# Patient Record
Sex: Male | Born: 1960 | Race: White | Hispanic: No | Smoking: Never smoker
Health system: Southern US, Community
[De-identification: ages and names within clinical notes are randomized; demographics above are authoritative.]

## PROBLEM LIST (undated history)

## (undated) DIAGNOSIS — E119 Type 2 diabetes mellitus without complications: Secondary | ICD-10-CM

## (undated) DIAGNOSIS — Z5189 Encounter for other specified aftercare: Secondary | ICD-10-CM

## (undated) DIAGNOSIS — N289 Disorder of kidney and ureter, unspecified: Secondary | ICD-10-CM

## (undated) DIAGNOSIS — I10 Essential (primary) hypertension: Secondary | ICD-10-CM

## (undated) HISTORY — PX: HERNIA REPAIR: SHX51

## (undated) HISTORY — PX: COMBINED KIDNEY-PANCREAS TRANSPLANT: SHX1382

## (undated) HISTORY — PX: ABDOMINAL SURGERY: SHX537

## (undated) HISTORY — PX: NEPHRECTOMY TRANSPLANTED ORGAN: SUR880

---

## 1998-08-05 ENCOUNTER — Ambulatory Visit (HOSPITAL_COMMUNITY): Admission: RE | Admit: 1998-08-05 | Discharge: 1998-08-05 | Payer: Self-pay | Admitting: Nephrology

## 1998-09-08 ENCOUNTER — Encounter: Payer: Self-pay | Admitting: Internal Medicine

## 1998-09-08 ENCOUNTER — Observation Stay (HOSPITAL_COMMUNITY): Admission: AD | Admit: 1998-09-08 | Discharge: 1998-09-09 | Payer: Self-pay | Admitting: Internal Medicine

## 1998-09-09 ENCOUNTER — Encounter: Payer: Self-pay | Admitting: Internal Medicine

## 1998-11-15 ENCOUNTER — Ambulatory Visit (HOSPITAL_COMMUNITY): Admission: RE | Admit: 1998-11-15 | Discharge: 1998-11-15 | Payer: Self-pay

## 1998-11-24 ENCOUNTER — Ambulatory Visit (HOSPITAL_COMMUNITY): Admission: RE | Admit: 1998-11-24 | Discharge: 1998-11-24 | Payer: Self-pay

## 1998-12-06 ENCOUNTER — Ambulatory Visit (HOSPITAL_COMMUNITY): Admission: RE | Admit: 1998-12-06 | Discharge: 1998-12-06 | Payer: Self-pay | Admitting: Internal Medicine

## 1998-12-19 ENCOUNTER — Ambulatory Visit (HOSPITAL_COMMUNITY): Admission: RE | Admit: 1998-12-19 | Discharge: 1998-12-19 | Payer: Self-pay | Admitting: Internal Medicine

## 2000-07-03 ENCOUNTER — Ambulatory Visit (HOSPITAL_COMMUNITY): Admission: RE | Admit: 2000-07-03 | Discharge: 2000-07-03 | Payer: Self-pay | Admitting: Neurosurgery

## 2000-07-03 ENCOUNTER — Encounter: Payer: Self-pay | Admitting: Neurosurgery

## 2000-07-14 ENCOUNTER — Emergency Department (HOSPITAL_COMMUNITY): Admission: EM | Admit: 2000-07-14 | Discharge: 2000-07-14 | Payer: Self-pay | Admitting: Emergency Medicine

## 2000-07-14 ENCOUNTER — Encounter: Payer: Self-pay | Admitting: Emergency Medicine

## 2000-11-01 ENCOUNTER — Encounter: Admission: RE | Admit: 2000-11-01 | Discharge: 2001-01-30 | Payer: Self-pay | Admitting: Internal Medicine

## 2001-12-19 ENCOUNTER — Encounter: Payer: Self-pay | Admitting: Emergency Medicine

## 2001-12-19 ENCOUNTER — Emergency Department (HOSPITAL_COMMUNITY): Admission: AC | Admit: 2001-12-19 | Discharge: 2001-12-19 | Payer: Self-pay

## 2014-08-23 ENCOUNTER — Other Ambulatory Visit: Payer: Self-pay

## 2014-08-23 ENCOUNTER — Encounter (HOSPITAL_COMMUNITY): Payer: Self-pay

## 2014-08-23 ENCOUNTER — Emergency Department (HOSPITAL_COMMUNITY): Payer: Medicare Other

## 2014-08-23 ENCOUNTER — Emergency Department (HOSPITAL_COMMUNITY)
Admission: EM | Admit: 2014-08-23 | Discharge: 2014-08-23 | Disposition: A | Discharge status: Home / Self Care | Payer: Medicare Other | Attending: Emergency Medicine | Admitting: Emergency Medicine

## 2014-08-23 DIAGNOSIS — R42 Dizziness and giddiness: Secondary | ICD-10-CM | POA: Insufficient documentation

## 2014-08-23 DIAGNOSIS — R51 Headache: Secondary | ICD-10-CM | POA: Insufficient documentation

## 2014-08-23 DIAGNOSIS — Z9483 Pancreas transplant status: Secondary | ICD-10-CM

## 2014-08-23 DIAGNOSIS — Z87448 Personal history of other diseases of urinary system: Secondary | ICD-10-CM | POA: Insufficient documentation

## 2014-08-23 DIAGNOSIS — E119 Type 2 diabetes mellitus without complications: Secondary | ICD-10-CM | POA: Diagnosis not present

## 2014-08-23 DIAGNOSIS — I10 Essential (primary) hypertension: Secondary | ICD-10-CM | POA: Diagnosis not present

## 2014-08-23 DIAGNOSIS — Z940 Kidney transplant status: Secondary | ICD-10-CM

## 2014-08-23 DIAGNOSIS — R112 Nausea with vomiting, unspecified: Secondary | ICD-10-CM | POA: Diagnosis present

## 2014-08-23 HISTORY — DX: Encounter for other specified aftercare: Z51.89

## 2014-08-23 HISTORY — DX: Disorder of kidney and ureter, unspecified: N28.9

## 2014-08-23 HISTORY — DX: Type 2 diabetes mellitus without complications: E11.9

## 2014-08-23 HISTORY — DX: Essential (primary) hypertension: I10

## 2014-08-23 LAB — COMPREHENSIVE METABOLIC PANEL
ALBUMIN: 3.7 g/dL (ref 3.5–5.2)
ALT: 24 U/L (ref 0–53)
ANION GAP: 11 (ref 5–15)
AST: 21 U/L (ref 0–37)
Alkaline Phosphatase: 77 U/L (ref 39–117)
BUN: 32 mg/dL — ABNORMAL HIGH (ref 6–23)
CALCIUM: 9.6 mg/dL (ref 8.4–10.5)
CHLORIDE: 109 meq/L (ref 96–112)
CO2: 23 meq/L (ref 19–32)
Creatinine, Ser: 1.25 mg/dL (ref 0.50–1.35)
GFR calc non Af Amer: 63 mL/min — ABNORMAL LOW (ref >90)
GFR, EST AFRICAN AMERICAN: 73 mL/min — AB (ref >90)
Glucose, Bld: 127 mg/dL — ABNORMAL HIGH (ref 70–99)
POTASSIUM: 4.6 meq/L (ref 3.7–5.3)
SODIUM: 143 meq/L (ref 137–147)
TOTAL PROTEIN: 7.2 g/dL (ref 6.0–8.3)
Total Bilirubin: 0.5 mg/dL (ref 0.3–1.2)

## 2014-08-23 LAB — URINALYSIS, ROUTINE W REFLEX MICROSCOPIC
BILIRUBIN URINE: NEGATIVE
GLUCOSE, UA: NEGATIVE mg/dL
Hgb urine dipstick: NEGATIVE
KETONES UR: NEGATIVE mg/dL
Leukocytes, UA: NEGATIVE
Nitrite: NEGATIVE
PH: 6 (ref 5.0–8.0)
PROTEIN: NEGATIVE mg/dL
Specific Gravity, Urine: 1.02 (ref 1.005–1.030)
Urobilinogen, UA: 0.2 mg/dL (ref 0.0–1.0)

## 2014-08-23 LAB — CBC WITH DIFFERENTIAL/PLATELET
BASOS ABS: 0 10*3/uL (ref 0.0–0.1)
Basophils Relative: 0 % (ref 0–1)
Eosinophils Absolute: 0.1 10*3/uL (ref 0.0–0.7)
Eosinophils Relative: 1 % (ref 0–5)
HEMATOCRIT: 39.9 % (ref 39.0–52.0)
Hemoglobin: 14.3 g/dL (ref 13.0–17.0)
LYMPHS PCT: 8 % — AB (ref 12–46)
Lymphs Abs: 0.6 10*3/uL — ABNORMAL LOW (ref 0.7–4.0)
MCH: 33.6 pg (ref 26.0–34.0)
MCHC: 35.8 g/dL (ref 30.0–36.0)
MCV: 93.9 fL (ref 78.0–100.0)
MONO ABS: 0.2 10*3/uL (ref 0.1–1.0)
Monocytes Relative: 3 % (ref 3–12)
NEUTROS ABS: 7.2 10*3/uL (ref 1.7–7.7)
NEUTROS PCT: 88 % — AB (ref 43–77)
PLATELETS: 156 10*3/uL (ref 150–400)
RBC: 4.25 MIL/uL (ref 4.22–5.81)
RDW: 13.2 % (ref 11.5–15.5)
WBC: 8.2 10*3/uL (ref 4.0–10.5)

## 2014-08-23 MED ORDER — MECLIZINE HCL 25 MG PO TABS: 25.0000 mg | ORAL_TABLET | Freq: Three times a day (TID) | ORAL | Status: AC | PRN

## 2014-08-23 MED ORDER — DIAZEPAM 5 MG/ML IJ SOLN
5.0000 mg | Freq: Once | INTRAMUSCULAR | Status: AC
  Administered 2014-08-23: 5 mg via INTRAVENOUS
  Filled 2014-08-23: qty 2

## 2014-08-23 MED ORDER — PROMETHAZINE HCL 25 MG/ML IJ SOLN
25.0000 mg | Freq: Once | INTRAMUSCULAR | Status: AC
  Administered 2014-08-23: 25 mg via INTRAMUSCULAR
  Filled 2014-08-23: qty 1

## 2014-08-23 MED ORDER — DIAZEPAM 5 MG PO TABS: 5.0000 mg | ORAL_TABLET | Freq: Two times a day (BID) | ORAL | Status: AC

## 2014-08-23 MED ORDER — MECLIZINE HCL 12.5 MG PO TABS
25.0000 mg | ORAL_TABLET | Freq: Once | ORAL | Status: AC
  Administered 2014-08-23: 25 mg via ORAL
  Filled 2014-08-23: qty 2

## 2014-08-23 MED ORDER — SODIUM CHLORIDE 0.9 % IV BOLUS (SEPSIS)
1000.0000 mL | Freq: Once | INTRAVENOUS | Status: AC
  Administered 2014-08-23: 1000 mL via INTRAVENOUS

## 2014-08-23 NOTE — ED Notes (Signed)
Patient states "I really need to pee and I can't pee laying down. I can't sit up or stand up because I get really dizzy and vomit." Patient requesting urinary catheter to empty bladder. Advised Dr Judd LieneLo. Verbal order for in and out cath to empty bladder.

## 2014-08-23 NOTE — Discharge Instructions (Signed)
Meclizine as prescribed as needed for vertigo.  Valium as prescribed as needed for symptoms not relieved with meclizine.  Follow-up with your primary Dr. If not improving in the next week, and return to the ER if your symptoms substantially worsen or change.   Vertigo Vertigo means you feel like you or your surroundings are moving when they are not. Vertigo can be dangerous if it occurs when you are at work, driving, or performing difficult activities.  CAUSES  Vertigo occurs when there is a conflict of signals sent to your brain from the visual and sensory systems in your body. There are many different causes of vertigo, including:  Infections, especially in the inner ear.  A bad reaction to a drug or misuse of alcohol and medicines.  Withdrawal from drugs or alcohol.  Rapidly changing positions, such as lying down or rolling over in bed.  A migraine headache.  Decreased blood flow to the brain.  Increased pressure in the brain from a head injury, infection, tumor, or bleeding. SYMPTOMS  You may feel as though the world is spinning around or you are falling to the ground. Because your balance is upset, vertigo can cause nausea and vomiting. You may have involuntary eye movements (nystagmus). DIAGNOSIS  Vertigo is usually diagnosed by physical exam. If the cause of your vertigo is unknown, your caregiver may perform imaging tests, such as an MRI scan (magnetic resonance imaging). TREATMENT  Most cases of vertigo resolve on their own, without treatment. Depending on the cause, your caregiver may prescribe certain medicines. If your vertigo is related to body position issues, your caregiver may recommend movements or procedures to correct the problem. In rare cases, if your vertigo is caused by certain inner ear problems, you may need surgery. HOME CARE INSTRUCTIONS   Follow your caregiver's instructions.  Avoid driving.  Avoid operating heavy machinery.  Avoid performing any  tasks that would be dangerous to you or others during a vertigo episode.  Tell your caregiver if you notice that certain medicines seem to be causing your vertigo. Some of the medicines used to treat vertigo episodes can actually make them worse in some people. SEEK IMMEDIATE MEDICAL CARE IF:   Your medicines do not relieve your vertigo or are making it worse.  You develop problems with talking, walking, weakness, or using your arms, hands, or legs.  You develop severe headaches.  Your nausea or vomiting continues or gets worse.  You develop visual changes.  A family member notices behavioral changes.  Your condition gets worse. MAKE SURE YOU:  Understand these instructions.  Will watch your condition.  Will get help right away if you are not doing well or get worse. Document Released: 07/04/2005 Document Revised: 12/17/2011 Document Reviewed: 04/12/2011 [facility] Patient Information 2015 [place], [place]. This information is not intended to replace advice given to you by your health care provider. Make sure you discuss any questions you have with your health care provider.

## 2014-08-23 NOTE — ED Provider Notes (Addendum)
CSN: [id number]     Arrival date & time 08/23/14  1037 History  This chart was scribed for Thomas Lyonsouglas Willodene Stallings, MD by Abel PrestoKara Demonbreun, ED Scribe. This patient was seen in room APA06/APA06 and the patient's care was started at 11:22 AM.    Chief Complaint  Patient presents with  . Nausea  . Emesis    HPI HPI Comments: Thomas Oconnor is a 53 y.o. male with history of who presents to the Emergency Department complaining of dizziness with onset last night.  Pt notes associated nausea and vomiting 4-7 times starting this morning. Pt also notes pain in center of forehead. Pt notes abdominal discomfort since he has been unable to get up and have a bowel movement or urinate.  Pt has FHx of vertigo. Pt has PMHx of juvenile diabetes, blindness in left eye, 5 hernias, 13 staples in head, 2 kidney transplants, and a pancreas transplant.  He states he has neuropathy complications and has needed to rely on his cane more frequently in the past few days. Pt denies tinnitus, loss of hearing, head injury, and fever.    Past Medical History  Diagnosis Date  . Renal disorder   . Hypertension   . Diabetes mellitus without complication   . Blood transfusion without reported diagnosis    Past Surgical History  Procedure Laterality Date  . Nephrectomy transplanted organ    . Hernia repair    . Abdominal surgery    . Combined kidney-pancreas transplant Bilateral    History reviewed. No pertinent family history. History  Substance Use Topics  . Smoking status: Never Smoker   . Smokeless tobacco: Not on file  . Alcohol Use: No    Review of Systems  A complete 10 system review of systems was obtained and all systems are negative except as noted in the HPI and PMH.      Allergies  Review of patient's allergies indicates no known allergies.  Home Medications   Prior to Admission medications   Not on File   BP 153/88 mmHg  Pulse 80  Temp(Src) 98 F (36.7 C) (Oral)  Resp 15  Ht 5' 8.5" (1.74 m)   Wt 180 lb (81.647 kg)  BMI 26.97 kg/m2  SpO2 95% Physical Exam  Constitutional: He is oriented to person, place, and time. He appears well-developed and well-nourished. No distress.  HENT:  Head: Normocephalic and atraumatic.  Mouth/Throat: Oropharynx is clear and moist.  Eyes: EOM are normal. Pupils are equal, round, and reactive to light.  Neck: Normal range of motion. Neck supple.  Cardiovascular: Normal rate, regular rhythm, normal heart sounds and intact distal pulses.   No murmur heard. Pulmonary/Chest: Effort normal and breath sounds normal. No respiratory distress. He has no wheezes.  Abdominal: Soft. Bowel sounds are normal. He exhibits no distension. There is no tenderness.  Musculoskeletal: Normal range of motion.  Lymphadenopathy:    He has no cervical adenopathy.  Neurological: He is alert and oriented to person, place, and time. No cranial nerve deficit. He exhibits normal muscle tone. Coordination normal.  Skin: Skin is warm and dry. He is not diaphoretic.  Nursing note and vitals reviewed.   ED Course  Procedures (including critical care time) DIAGNOSTIC STUDIES: Oxygen Saturation is 95% on room air, normal by my interpretation.    COORDINATION OF CARE: 11:28 AM Discussed treatment plan with patient at beside, the patient agrees with the plan and has no further questions at this time.   Labs Review Labs Reviewed -  No data to display  Imaging Review No results found.   EKG Interpretation   Date/Time:  Monday August 23 2014 10:38:31 EST Ventricular Rate:  84 PR Interval:  200 QRS Duration: 87 QT Interval:  371 QTC Calculation: 438 R Axis:   78 Text Interpretation:  Sinus rhythm Normal ECG Confirmed by DELOS  MD,  Marquesha Robideau ([id number]) on 08/23/2014 1:25:26 PM      MDM   Final diagnoses:  None    Patient is a 53 year old male with past medical history including type 1 diabetes and end-stage renal disease. He is status post pancreas and renal  transplant. He presents today with complaints of dizziness. He describes this as a spinning sensation that worsens with head movement and change in position. It is improved with rest and closing his eyes.  Symptoms are consistent with a peripheral vertigo and workup reveals nothing to suggest an alternative diagnosis. He is feeling better with IV fluids, meclizine, and Valium. At this point he will be discharged to home with prescriptions for the above medications and understands to return if his symptoms substantially worsen or change.    Thomas Lyonsouglas Carrel Leather, MD 08/23/14 1327  Thomas Lyonsouglas Roberta Kelly, MD 09/01/14 [id number]  Thomas Lyonsouglas Aizah Gehlhausen, MD 09/07/14 [phone]

## 2014-08-23 NOTE — ED Notes (Signed)
Pt reports dizziness and n/v experienced this morning. Pt reports 6 large amounts of emesis. Pt reports experiencing loss in stabililty of the course of 3 days.

## 2014-08-23 NOTE — ED Notes (Signed)
Patient currently vomiting in room at this time. Patient states he sat up with family members assistance and started vomiting. Advised Dr Judd LieneLo. Verbal order for phenergan 25 mg IM.

## 2014-08-23 NOTE — ED Notes (Signed)
Emptied bladder of 900 ml urine via in and out catheter. Patient tolerated well.

## 2014-08-23 NOTE — ED Notes (Signed)
Pt woke up this morning swimming headed with n/v, Hx of HTN, Inner ear, x1 vomiting. Hx of 2 kidney transplants, x5 Hernias. Pt is completely blind in left eye. 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 Difficulty urinating.  EMS Vitals BP 170/71 HR 80 SPO2 97

## 2014-08-23 NOTE — ED Notes (Signed)
Discharged patient via wheelchair with sister to drive him home. Patient ambulated to wheelchair with 2 assists. No vomiting noted since phenergan administration.

## 2014-11-27 IMAGING — CT CT HEAD W/O CM
1 series · 16 of 30 positions shown, 20 images · non-contrast
Comparison: None.

CLINICAL DATA: Dizziness.

EXAM:
CT HEAD WITHOUT CONTRAST
TECHNIQUE: Contiguous axial images were obtained from the base of the skull
through the vertex without intravenous contrast.

[Series 2: headseq 4.8 h37s · axial · 0.45mm/px · z∈[+108,+273]mm · 16 of 36 slices shown, 20 images]
[im 2/36  brain]
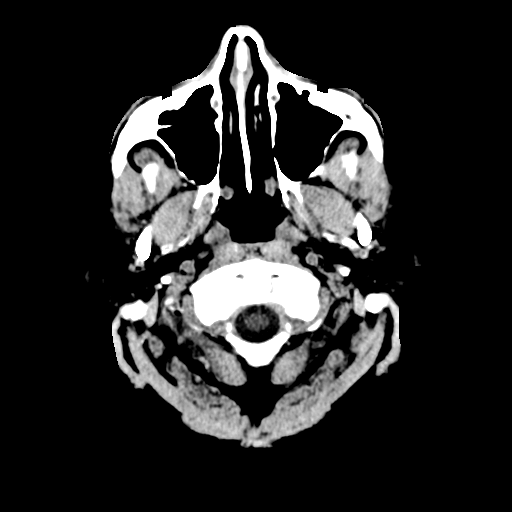
[im 2/36  bone]
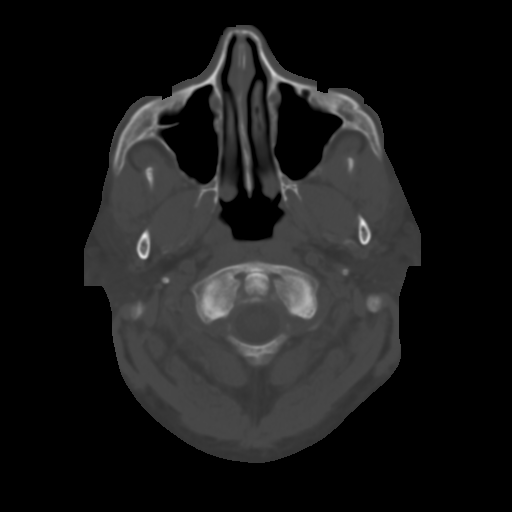
[im 4/36  brain]
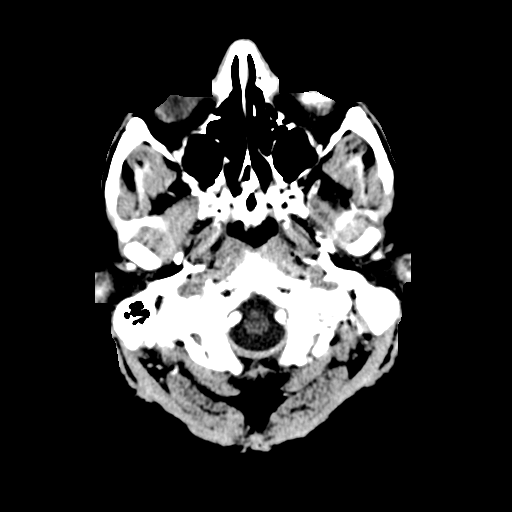
[im 7/36  brain]
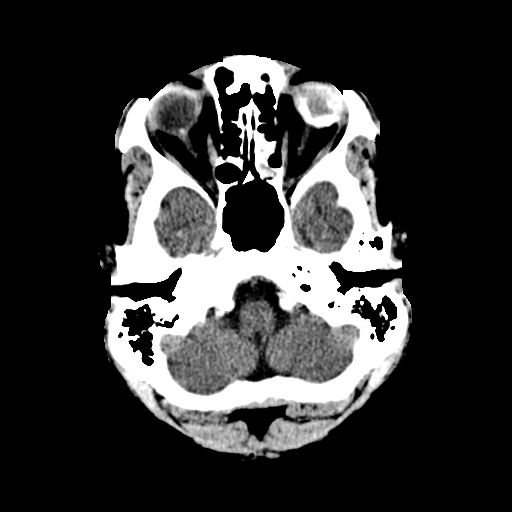
[im 9/36  brain]
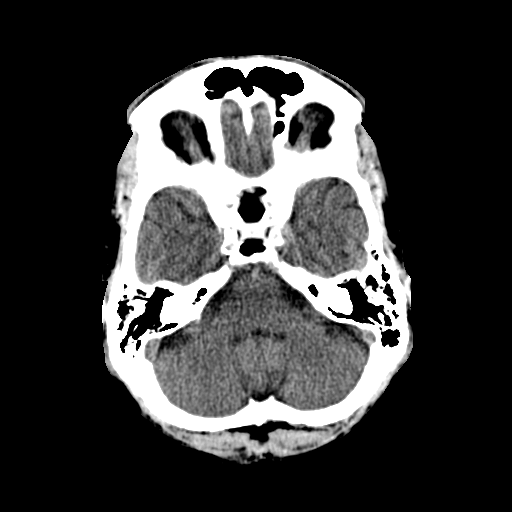
[im 10/36  brain]
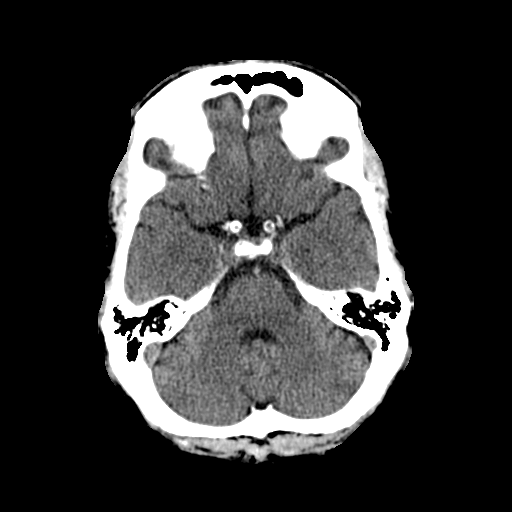
[im 10/36  bone]
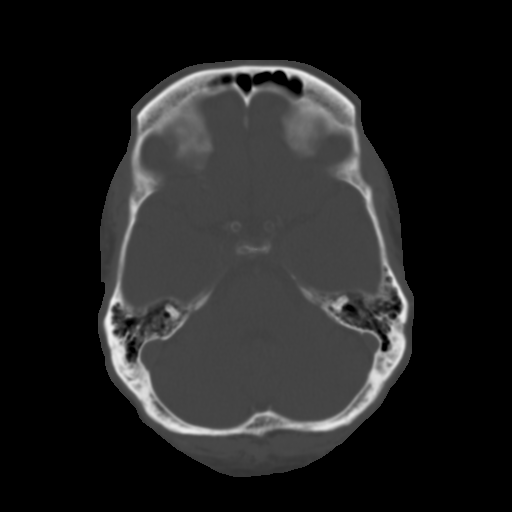
[im 13/36  brain]
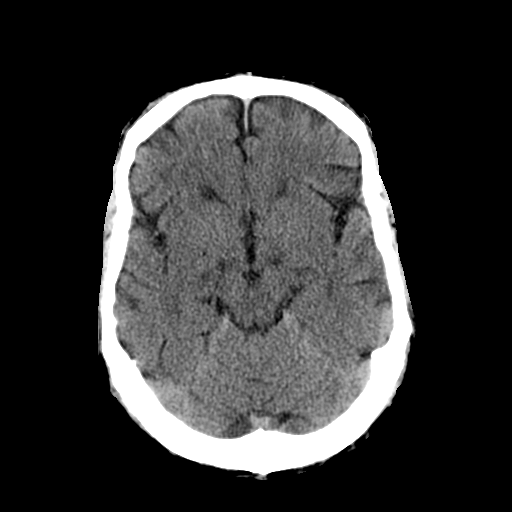
[im 15/36  brain]
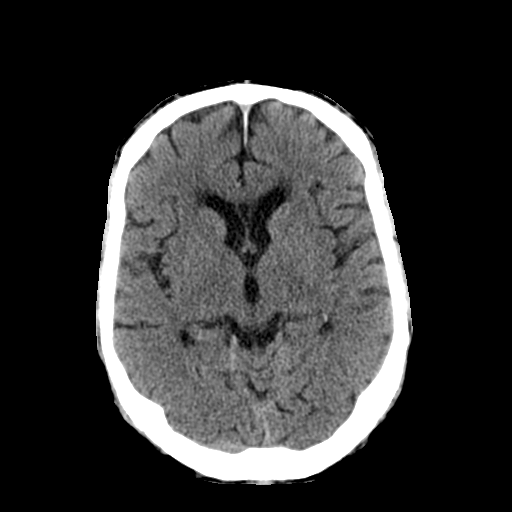
[im 17/36  brain]
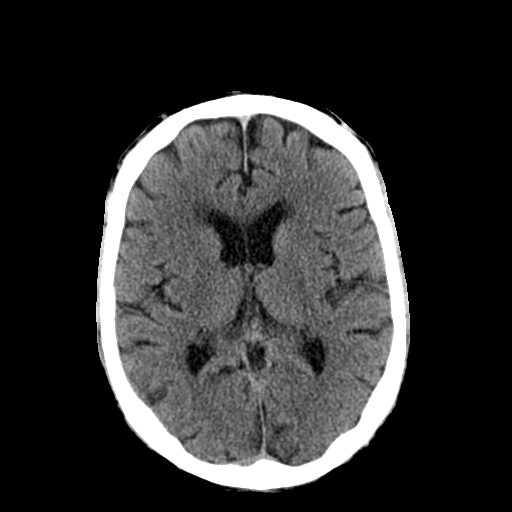
[im 19/36  brain]
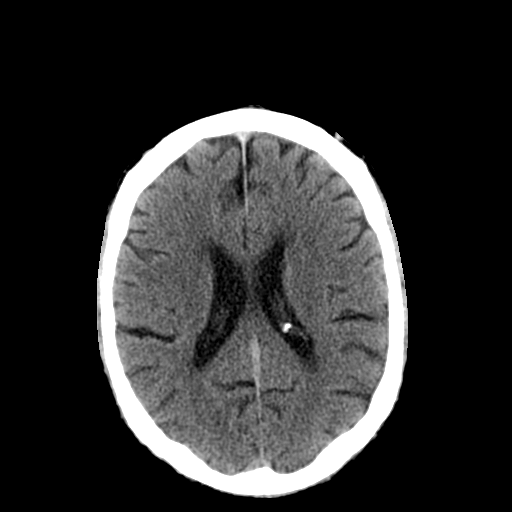
[im 19/36  bone]
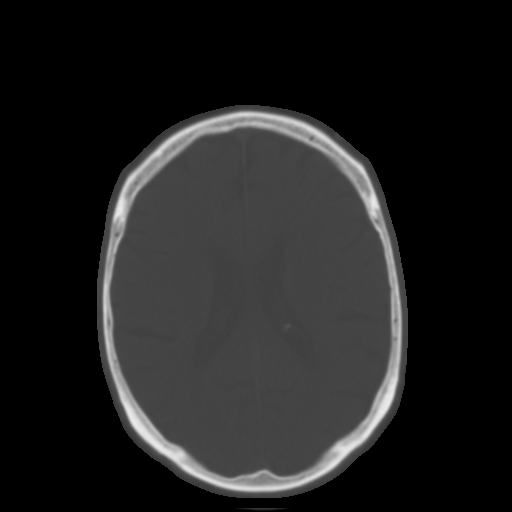
[im 21/36  brain]
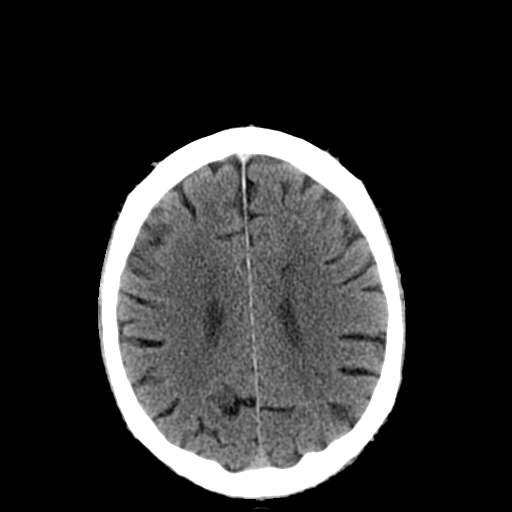
[im 23/36  brain]
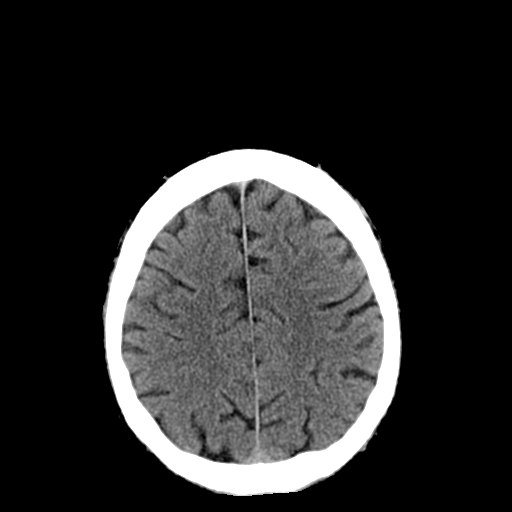
[im 26/36  brain]
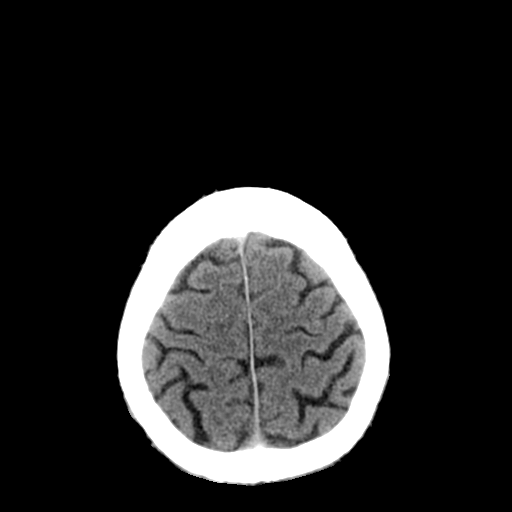
[im 27/36  brain]
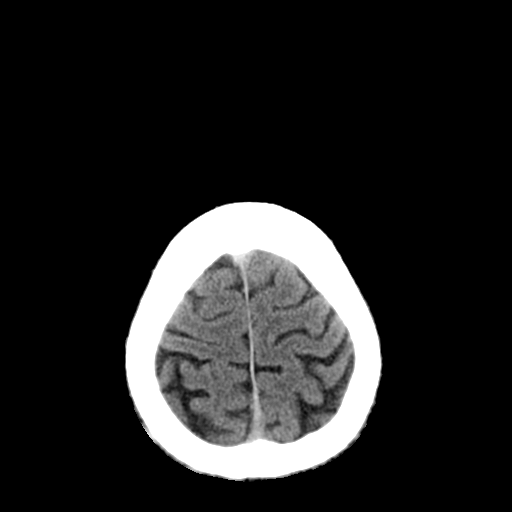
[im 27/36  bone]
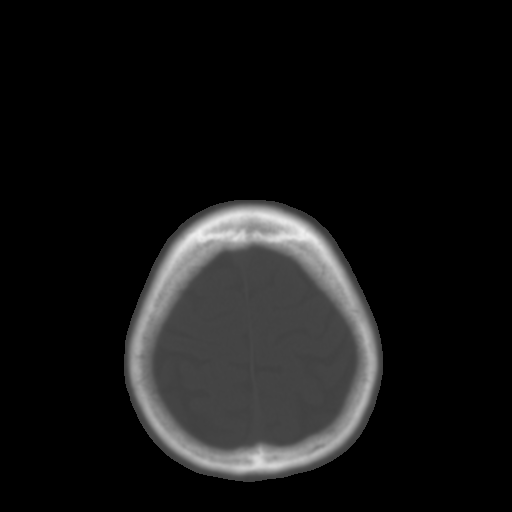
[im 29/36  brain]
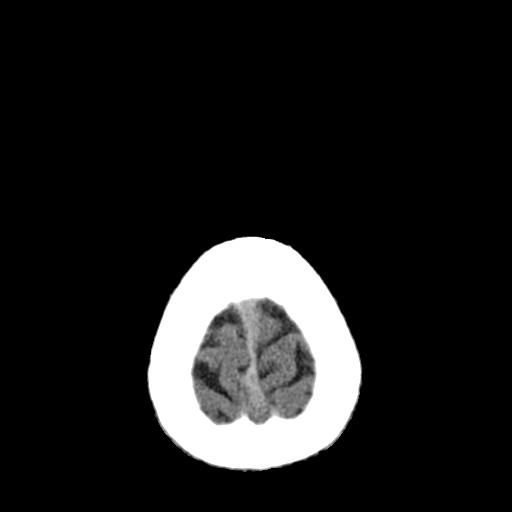
[im 32/36  brain]
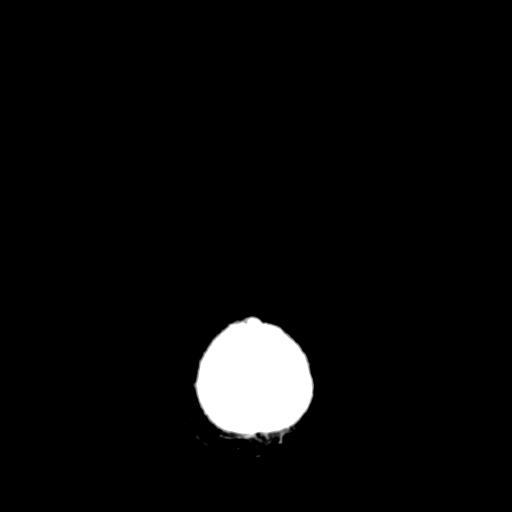
[im 34/36  brain]
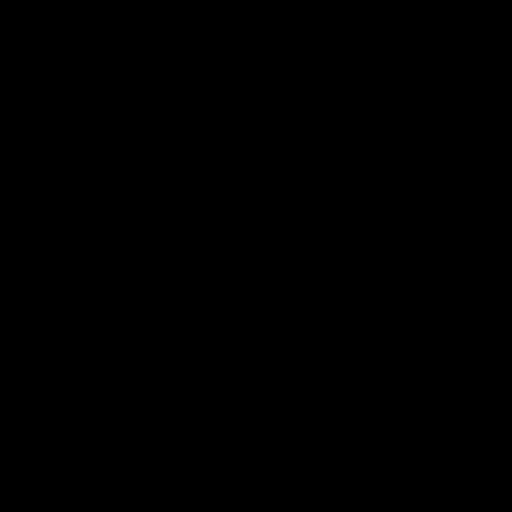

[16 of 30 positions shown; findings below may reference images not displayed]

FINDINGS: Bony calvarium appears intact. Minimal diffuse cortical atrophy is
noted. Minimal chronic ischemic white matter disease is noted. No
mass effect or midline shift is noted. Ventricular size is within
normal limits. There is no evidence of mass lesion, hemorrhage or
acute infarction.
IMPRESSION: Minimal diffuse cortical atrophy. Minimal chronic ischemic white
matter disease. No acute intracranial abnormality seen.

## 2015-01-19 ENCOUNTER — Ambulatory Visit (HOSPITAL_COMMUNITY): Payer: Medicare Other | Attending: Orthopedic Surgery | Admitting: Physical Therapy

## 2015-01-19 DIAGNOSIS — H540 Blindness, both eyes: Secondary | ICD-10-CM | POA: Diagnosis not present

## 2015-01-19 DIAGNOSIS — Z9483 Pancreas transplant status: Secondary | ICD-10-CM | POA: Diagnosis not present

## 2015-01-19 DIAGNOSIS — R262 Difficulty in walking, not elsewhere classified: Secondary | ICD-10-CM | POA: Diagnosis not present

## 2015-01-19 DIAGNOSIS — Z9181 History of falling: Secondary | ICD-10-CM | POA: Diagnosis not present

## 2015-01-19 DIAGNOSIS — I10 Essential (primary) hypertension: Secondary | ICD-10-CM | POA: Insufficient documentation

## 2015-01-19 DIAGNOSIS — Z940 Kidney transplant status: Secondary | ICD-10-CM | POA: Diagnosis not present

## 2015-01-19 DIAGNOSIS — H8110 Benign paroxysmal vertigo, unspecified ear: Secondary | ICD-10-CM | POA: Diagnosis present

## 2015-01-19 NOTE — Therapy (Signed)
[facility] [address] [place], [place], [address] Phone: [phone]   Fax:  [phone]  Physical Therapy Evaluation  Patient Details  Name: Thomas Oconnor MRN: [id number] Date of Birth: 1961/05/25 Referring Provider:  Corie Chiquito, MD  Encounter Date: 01/19/2015      PT End of Session - 01/19/15 1214    Visit Number 1   Number of Visits 16   Date for PT Re-Evaluation 02/18/15   Authorization Type medicare   Authorization Time Period 12/19/14-6/123/16   Authorization - Visit Number 1   Authorization - Number of Visits 10   PT Start Time 0930   PT Stop Time 1015   PT Time Calculation (min) 45 min   Activity Tolerance Patient tolerated treatment well   Behavior During Therapy [facility] for tasks assessed/performed      Past Medical History  Diagnosis Date  . Renal disorder   . Hypertension   . Diabetes mellitus without complication   . Blood transfusion without reported diagnosis     Past Surgical History  Procedure Laterality Date  . Nephrectomy transplanted organ    . Hernia repair    . Abdominal surgery    . Combined kidney-pancreas transplant Bilateral     There were no vitals filed for this visit.  Visit Diagnosis:  Benign paroxysmal positional vertigo, unspecified laterality  At high risk for falls  Difficulty walking      Subjective Assessment - 01/19/15 0937    Subjective No symptom of vertigo oday, patient notes a denseness in forehead when symptoms are present.    Pertinent History Patient began having vertigo 08/22/14 and patient was leanign heavy on cane , unsure why but when he sat up the "room took off and everything sterted spinning patient cauge self to prevent self from falling. patient was throwing up due to severity of dizziness. Patient went to hospital. patient noted continued dry heaves. patient was given mecklazine and stopped throwing up. patient has continued to have some minor bouts of  vertigo but with decreased severity. aptient notes that 9/10  severity in light headedness. 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 Patient had type 1 diabetes. patient has a history of diabetic neurapathy that has since improved. .    Diagnostic tests no MRI or CT scan yet resulting to deifficulty traveling to docotrs office.    Patient Stated Goals patient want to be abl to decrease the frequency and intensity of vertigo/dizziness.    Currently in Pain? No/denies            [facility] PT Assessment - 01/19/15 0001    Assessment   Medical Diagnosis Vertigo   Onset Date 08/22/14   Next MD Visit Nelida Gores   Prior Therapy no   Balance Screen   Has the patient fallen in the past 6 months No   Has the patient had a decrease in activity level because of a fear of falling?  No   Is the patient reluctant to leave their home because of a fear of falling?  No   Prior Function   Level of Independence Independent with basic ADLs;Needs assistance with ADLs   Observation/Other Assessments   Focus on Therapeutic Outcomes (FOTO)  45% limited   Dynamic Gait Index   Level Surface Moderate Impairment   Change in Gait Speed Moderate Impairment   Gait with Horizontal Head Turns Moderate Impairment   Gait with Vertical Head Turns  Moderate Impairment   Gait and Pivot Turn Severe Impairment   Step Over Obstacle Severe Impairment   Step Around Obstacles Severe Impairment   Steps Severe Impairment   Total Score 4      Negative hall pike Dix bilateral Eye pursuits with head turns         PT Short Term Goals - 01/19/15 1233    PT SHORT TERM GOAL #1   Title Patient will dmeonstrate a DGI score of >10 to indicate improvign balance during gait.    Time 4   Period Weeks   Status New   PT SHORT TERM GOAL #2   Title Patient ill state decreased frequency of vertigo symptoms to <1x daily and at and intensity <5/10   Baseline Experiences vertigo >1x daily  with symptom intensity  >9/10   Time 4   Period Weeks   Status New   PT SHORT TERM GOAL #3   Title Patient will dmeosntrate independence of HEP.    Time 4   Period Weeks   Status New           PT Long Term Goals - 01/19/15 1235    PT LONG TERM GOAL #1   Title Patient will dmeonstrate a DGI score of >20 to indicate improving balance during gait and patient not at high risk of falls.    Time 8   Period Weeks   Status New   PT LONG TERM GOAL #2   Title Patient ill state decreased frequency of vertigo symptoms to <1x weekly and at and intensity <3/10   Time 8   Period Weeks   Status New   PT LONG TERM GOAL #3   Title Patient will state confidence in standing and sitting balance.    Time 8   Period Weeks   Status New               Plan - 01/19/15 1224    Clinical Impression Statement Physical therapist had difficulty reproducing patient's vertigo symptoms attributed to Benign Paraoxsysmal Peripheral Vertigo as patient never experienced symptoms thorughotu session despite perfomance of provacative tests. Patient was educated on visual tracking exercises and visual stabilization exercises to HEP. Patient's gait was then assessed with Patient dispalsying decreased balance and difficulty walking possibly related to LE weakness and stiffness but also attributed to patient's dispalsying decreased body awareness and unsurety of self in space with patient displaying a high falls risk as indicated by performance on Dynamic gait Index. Patient will benefit from skilled physical therapy to further assess balance related to vertigo and movemnt dysfunction so patient can return to regular exercise and walking without a high risk for falls.    Pt will benefit from skilled therapeutic intervention in order to improve on the following deficits Abnormal gait;Decreased endurance;Improper body mechanics;Postural dysfunction;Pain;Impaired flexibility;Decreased strength;Decreased activity  tolerance;Difficulty walking;Decreased balance;Impaired sensation   Rehab Potential Fair   PT Frequency 2x / week   PT Duration 8 weeks   PT Treatment/Interventions Neuromuscular re-education;Stair training;ADLs/Self Care Home Management;Patient/family education;Therapeutic activities;Therapeutic exercise;Manual techniques;Balance training   PT Next Visit Plan Assess LE strength, perform Berg balance score. Continue education and performance of visual stabilization exercises Review hall pike dix if symptoms present.           G-Codes - 01/19/15 1238    Functional Assessment Tool Used FOTO 45% limited   Functional Limitation Mobility: Walking and moving around   Mobility: Walking and Moving Around Current Status ([id number]) At least 40 percent  but less than 60 percent impaired, limited or restricted   Mobility: Walking and Moving Around Goal Status [phone]) At least 20 percent but less than 40 percent impaired, limited or restricted       Problem List There are no active problems to display for this patient.  Jerilee Field PT DPT [phone]  [facility] [address] [place], [place], [address] Phone: [phone]   Fax:  [phone]

## 2015-01-26 ENCOUNTER — Ambulatory Visit (HOSPITAL_COMMUNITY): Payer: Medicare Other | Admitting: Physical Therapy

## 2015-01-26 DIAGNOSIS — R262 Difficulty in walking, not elsewhere classified: Secondary | ICD-10-CM

## 2015-01-26 DIAGNOSIS — Z9181 History of falling: Secondary | ICD-10-CM

## 2015-01-26 DIAGNOSIS — H8110 Benign paroxysmal vertigo, unspecified ear: Secondary | ICD-10-CM

## 2015-01-26 NOTE — Therapy (Signed)
[facility] [address] [place], [place], [address] Phone: [phone]   Fax:  [phone]  Physical Therapy Treatment  Patient Details  Name: CANDICE LUNNEY MRN: [id number] Date of Birth: [birth year] Referring Provider:  Corie Chiquito, MD  Encounter Date: 01/26/2015      PT End of Session - 01/26/15 0957    Visit Number 2   Number of Visits 12   Date for PT Re-Evaluation 02/18/15   Authorization Type medicare   Authorization Time Period 12/19/14-6/123/16   Authorization - Visit Number 2   Authorization - Number of Visits 10   PT Start Time 0850   PT Stop Time 1000   PT Time Calculation (min) 70 min   Activity Tolerance Patient tolerated treatment well      Past Medical History  Diagnosis Date  . Renal disorder   . Hypertension   . Diabetes mellitus without complication   . Blood transfusion without reported diagnosis     Past Surgical History  Procedure Laterality Date  . Nephrectomy transplanted organ    . Hernia repair    . Abdominal surgery    . Combined kidney-pancreas transplant Bilateral     There were no vitals filed for this visit.  Visit Diagnosis:  Benign paroxysmal positional vertigo, unspecified laterality  At high risk for falls  Difficulty walking      Subjective Assessment - 01/26/15 0855    Subjective I'm doing the eye exercises that is all I was given.  Pt states that he is not as dizzy as he has been.  States that it is very difficult to explain.  He has a pressure in his forehead.    Currently in Pain? No/denies            [facility] PT Assessment - 01/26/15 0001    Strength   Right Hip Flexion 3+/5   Right Hip Extension 3/5   Right Hip ABduction 2+/5   Left Hip Flexion 3+/5   Left Hip Extension 3/5   Left Hip ABduction 2+/5   Right Knee Flexion 5/5   Right Knee Extension 4/5   Left Knee Flexion 4-/5   Left Knee Extension 4/5   Right Ankle Dorsiflexion 3/5   Left Ankle  Dorsiflexion 3/5   Berg Balance Test   Sit to Stand Able to stand without using hands and stabilize independently   Standing Unsupported Able to stand 2 minutes with supervision   Sitting with Back Unsupported but Feet Supported on Floor or Stool Able to sit safely and securely 2 minutes   Stand to Sit Controls descent by using hands   Transfers Able to transfer with verbal cueing and /or supervision   Standing Unsupported with Eyes Closed Able to stand 10 seconds safely   Standing Ubsupported with Feet Together Able to place feet together independently and stand for 1 minute with supervision   From Standing, Reach Forward with Outstretched Arm Can reach forward >12 cm safely (5")   From Standing Position, Pick up Object from Floor Able to pick up shoe, needs supervision   From Standing Position, Turn to Look Behind Over each Shoulder Looks behind one side only/other side shows less weight shift   Turn 360 Degrees Needs assistance while turning   Standing Unsupported, Alternately Place Feet on Step/Stool Needs assistance to keep from falling or unable to try   Standing Unsupported, One Foot in Front Able to take small step independently and hold 30 seconds  Standing on One Leg Tries to lift leg/unable to hold 3 seconds but remains standing independently   Total Score 35                     [facility] Adult PT Treatment/Exercise - 01/26/15 0001    Exercises   Exercises Knee/Hip   Knee/Hip Exercises: Aerobic   Stationary Bike nustep Level 4; hills 3; x 10:00   Knee/Hip Exercises: Standing   SLS one finger hold x 3 B    Other Standing Knee Exercises Hallpike Dix manuever to Rt and LT; + Lt with horizontal nystagmus.    Knee/Hip Exercises: Seated   Long Arc Quad Both;5 reps   Other Seated Knee Exercises ankle dorsi/plantarflexion x 10   Other Seated Knee Exercises sit to stand x 10 no UE assist    Knee/Hip Exercises: Supine   Straight Leg Raises Both;5 reps   Knee/Hip Exercises:  Sidelying   Hip ABduction Both;5 reps   Knee/Hip Exercises: Prone   Hip Extension Both;5 reps                PT Education - 01/26/15 0957    Education provided Yes   Education Details For LE strengthening    Person(s) Educated Patient   Methods Explanation;Handout   Comprehension Returned demonstration;Verbalized understanding          PT Short Term Goals - 01/26/15 1012    PT SHORT TERM GOAL #1   Title Patient will dmeonstrate a DGI score of >10 to indicate improvign balance during gait.    Time 4   Period Weeks   Status On-going   PT SHORT TERM GOAL #2   Title Patient ill state decreased frequency of vertigo symptoms to <1x daily and at and intensity <5/10   Baseline Experiences vertigo >1x daily with symptom intensity  >9/10   Time 4   Period Weeks   Status On-going   PT SHORT TERM GOAL #3   Title Patient will dmeosntrate independence of HEP.    Time 4   Period Weeks   Status On-going           PT Long Term Goals - 01/26/15 1012    PT LONG TERM GOAL #1   Title Patient will dmeonstrate a DGI score of >20 to indicate improving balance during gait and patient not at high risk of falls.    Time 8   Period Weeks   Status On-going   PT LONG TERM GOAL #2   Title Patient ill state decreased frequency of vertigo symptoms to <1x weekly and at and intensity <3/10   Time 8   Period Weeks   Status On-going   PT LONG TERM GOAL #3   Title Patient will state confidence in standing and sitting balance.    Time 8   Status On-going   PT LONG TERM GOAL #4   Title Pt to be able to bend down and pick objects off the floor with confidence   Time 8   Period Weeks   Status New               Plan - 01/26/15 1006    Clinical Impression Statement Hallpike Dix manuever completed B with + Lt horizontal cannal pathology evoked.  Berg 35/54; and noted LE weakness with manual mm test B.  Pt will benefit from Lt horzontal canaltih correction as well as balance  activity.    PT Next Visit Plan Begin correction for Lt  hoizontal cannalith pathology; ( pt supine therapist holds head in slight flexion rotated to Right/slowly rotate head to left continuing to rotate as pt rolls prone; then pt goes to quadriped with therapist keeping head slightly flexed and to sitting); Pt to begin tandem stance; sit to stand functional squats,tandem and retro gt activities.         Problem List There are no active problems to display for this patient.  Virgina Organ, PT CLT [phone] 01/26/2015, 10:15 AM  [facility] [address] [place], [place], [address] Phone: [phone]   Fax:  [phone]

## 2015-01-26 NOTE — Patient Instructions (Signed)
Toe / Heel Raise (Sitting)   Sitting, raise heels, then rock back on heels and raise toes. Repeat _15___ times.  Copyright  VHI. All rights reserved.  Strengthening: Straight Leg Raise (Phase 1)   Tighten muscles on front of right thigh, then lift leg 18____ inches from surface, keeping knee locked.  Repeat __10__ times per set. Do _1___ sets per session. Do _2___ sessions per day. Repeat to left http://orth.exer.us/614   Copyright  VHI. All rights reserved.  Strengthening: Hip Abduction (Side-Lying)   Tighten muscles on front of left thigh, then lift leg ___15_ inches from surface, keeping knee locked.  Repeat __15__ times per set. Do ___1_ sets per session. Do __2__ sessions per day. Repeat to right  http://orth.exer.us/622   Copyright  VHI. All rights reserved.  Strengthening: Hip Extension (Prone)   Tighten muscles on front of left thigh, then lift leg 2____ inches from surface, keeping knee locked. Repeat _10___ times per set. Do 1____ sets per session. Do ___2_ sessions per day. Repeat to right http://orth.exer.us/620   Copyright  VHI. All rights reserved.

## 2015-01-28 ENCOUNTER — Ambulatory Visit (HOSPITAL_COMMUNITY): Payer: Medicare Other | Admitting: Physical Therapy

## 2015-01-28 DIAGNOSIS — H8110 Benign paroxysmal vertigo, unspecified ear: Secondary | ICD-10-CM

## 2015-01-28 DIAGNOSIS — Z9181 History of falling: Secondary | ICD-10-CM

## 2015-01-28 DIAGNOSIS — R262 Difficulty in walking, not elsewhere classified: Secondary | ICD-10-CM

## 2015-01-28 NOTE — Therapy (Signed)
[facility] [address] [place], [place], [address] Phone: [phone]   Fax:  [phone]  Physical Therapy Treatment  Patient Details  Name: Thomas Oconnor MRN: [id number] Date of Birth: 06/13/1961 Referring Provider:  Corie Chiquito, MD  Encounter Date: 01/28/2015      PT End of Session - 01/28/15 1110    Visit Number 3   Number of Visits 12   Date for PT Re-Evaluation 02/18/15   Authorization Type medicare   Authorization Time Period 12/19/14-6/123/16   Authorization - Visit Number 3   Authorization - Number of Visits 10   PT Start Time 1100   PT Stop Time 1155   PT Time Calculation (min) 55 min   Activity Tolerance Patient tolerated treatment well   Behavior During Therapy [facility] for tasks assessed/performed      Past Medical History  Diagnosis Date  . Renal disorder   . Hypertension   . Diabetes mellitus without complication   . Blood transfusion without reported diagnosis     Past Surgical History  Procedure Laterality Date  . Nephrectomy transplanted organ    . Hernia repair    . Abdominal surgery    . Combined kidney-pancreas transplant Bilateral     There were no vitals filed for this visit.  Visit Diagnosis:  Benign paroxysmal positional vertigo, unspecified laterality  At high risk for falls  Difficulty walking      Subjective Assessment - 01/28/15 1108    Subjective Patient states falling while walking down stairs/curbs, abnd notes one bout of dizziness since last session. Patient notes slight improvement in dizziness with visual tracking noting he performs them regularly.            [facility] Adult PT Treatment/Exercise - 01/28/15 0001    Knee/Hip Exercises: Standing   Forward Step Up Step Height: 2";Both   Forward Step Up Limitations with cain and contact guard   Functional Squat 2 sets;5 reps   Functional Squat Limitations from chair: 5x sit to stand   Gait Training tandem standing with min  assist for balance/safety 4x 30 seconds each   Other Standing Knee Exercises correction for Lt hoizontal cannalith pathology pt supine therapist holds head in slight flexion rotated to Right/slowly rotate head to left continuing to rotate as pt rolls prone; then pt goes to quadriped with therapist keeping head slightly flexed and to sitting)   Other Standing Knee Exercises 3D hip excursions 10x   Knee/Hip Exercises: Seated   Other Seated Knee Exercises overhead dumbbell matrix 5x each with 3lb dumbbells.          PT Education - 01/28/15 1149    Education provided Yes   Education Details 5x sit to stand, Lt horizontal canalith repositioning manuever, seated overhead dumbbell matrix, 3D hip excursions   Person(s) Educated Patient   Methods Explanation;Demonstration;Handout   Comprehension Verbalized understanding;Returned demonstration          PT Short Term Goals - 01/28/15 1106    PT SHORT TERM GOAL #1   Title Patient will dmeonstrate a DGI score of >10 to indicate improvign balance during gait.    Status On-going   PT SHORT TERM GOAL #2   Title Patient ill state decreased frequency of vertigo symptoms to <1x daily and at and intensity <5/10   Status On-going   PT SHORT TERM GOAL #3   Title Patient will dmeosntrate independence of HEP.    Status On-going  PT Long Term Goals - 01/28/15 1106    PT LONG TERM GOAL #1   Title Patient will dmeonstrate a DGI score of >20 to indicate improving balance during gait and patient not at high risk of falls.    Status On-going   PT LONG TERM GOAL #2   Title Patient ill state decreased frequency of vertigo symptoms to <1x weekly and at and intensity <3/10   Status On-going   PT LONG TERM GOAL #3   Title Patient will state confidence in standing and sitting balance.    Status On-going   PT LONG TERM GOAL #4   Title Pt to be able to bend down and pick objects off the floor with confidence   Status On-going   PT LONG TERM GOAL  #5   Title Patient to be bale to go up anddown curbs withotu falling with a cain   Baseline unable to step down curb without falling.           Plan - 01/28/15 1215    Clinical Impression Statement Began with correction for Lt hoizontal cannalith pathology; via Lt horizontal canolith repositioning manuever, with education of patient for self treatment. Patient voiced fear of falling with curbing ambualtion for whcih some of session foucsed with patient able to ambulate down 4 to 6" curg wihtotu assistance at end of session. LE strengtheing and balnce exercises introduced to bdecrease risk of falls.    PT Next Visit Plan Continue: Education on horizontal canolith repositioning manuever as needed, 5xsit to stand add weight, progress 3D hip excursions to split stance, continue tandem stance and progress to tandem gait and retro gait (non-tandem), increase step heigth to 4" for stair/curb training         Problem List There are no active problems to display for this patient.  Jerilee FieldCash Tyrica Afzal PT DPT [phone]  [facility] [address] [place], [place], [address] Phone: [phone]   Fax:  [phone]

## 2015-01-28 NOTE — Patient Instructions (Addendum)
For Dizziness pt supine therapist holds head in slight flexion rotated to Right slowly rotate head to left continuing to rotate as pt rolls prone;  then pt goes to all fours with therapist keeping head slightly flexed  and  Then to sitting  Functional Quadriceps: Sit to Stand   Sit on edge of chair, feet flat on floor. Stand upright, extending knees fully. Repeat 5 times per set. Do 1 sets per session. Do 3 to 5 sessions per day.  http://orth.exer.us/734   Copyright  VHI. All rights reserved.

## 2015-02-02 ENCOUNTER — Ambulatory Visit (HOSPITAL_COMMUNITY): Payer: Medicare Other | Admitting: Physical Therapy

## 2015-02-02 DIAGNOSIS — Z9181 History of falling: Secondary | ICD-10-CM

## 2015-02-02 DIAGNOSIS — H8110 Benign paroxysmal vertigo, unspecified ear: Secondary | ICD-10-CM | POA: Diagnosis not present

## 2015-02-02 DIAGNOSIS — R262 Difficulty in walking, not elsewhere classified: Secondary | ICD-10-CM

## 2015-02-02 NOTE — Therapy (Signed)
[facility] [address] [place], [place], [address] Phone: [phone]   Fax:  [phone]  Physical Therapy Treatment  Patient Details  Name: Thomas GirtLarry J Oconnor MRN: [id number] Date of Birth: [birth year] Referring Provider:  Corie Chiquitoooney, Jeffrey W, MD  Encounter Date: 02/02/2015      PT End of Session - 02/02/15 1005    Visit Number 4   Number of Visits 12   Date for PT Re-Evaluation 02/18/15   Authorization Type medicare   Authorization Time Period 12/19/14-6/123/16   Authorization - Visit Number 4   Authorization - Number of Visits 10   PT Start Time 1010   PT Stop Time 1055   PT Time Calculation (min) 45 min   Activity Tolerance Patient tolerated treatment well   Behavior During Therapy [facility] for tasks assessed/performed      Past Medical History  Diagnosis Date  . Renal disorder   . Hypertension   . Diabetes mellitus without complication   . Blood transfusion without reported diagnosis     Past Surgical History  Procedure Laterality Date  . Nephrectomy transplanted organ    . Hernia repair    . Abdominal surgery    . Combined kidney-pancreas transplant Bilateral     There were no vitals filed for this visit.  Visit Diagnosis:  Benign paroxysmal positional vertigo, unspecified laterality  At high risk for falls  Difficulty walking      Subjective Assessment - 02/02/15 1015    Subjective Patient notes increased fatgue today. resulting in decreased wgait speend, notes mild symptoms of vertigo, present intermittently during session.                          [facility] Adult PT Treatment/Exercise - 02/02/15 0001    Ambulation/Gait   Ambulation/Gait Yes   Ambulation Distance (Feet) 220 Feet   Assistive device Straight cane   Gait Pattern Decreased step length - right;Trendelenburg;Lateral hip instability;Lateral trunk lean to right   Ambulation Surface Level   Gait velocity 2.10   Gait Comments 4 cued 90  degree turns, and cues to visually scan environment for obstacles..   Knee/Hip Exercises: Stretches   Piriformis Stretch Other (comment)  B 10x3sec   Piriformis Stretch Limitations Seated FABER w/ repeat fwd lean   Knee/Hip Exercises: Standing   Forward Lunges Both;2 sets;5 sets   Forward Lunges Limitations // bars at [facility] and  zero UE support   Side Lunges Both;2 sets;5 sets   Side Lunges Limitations CGA with multiple LOB req PT correction; 1st set w/ ahnds, 2nd set without bilat.   Forward Step Up Step Height: 4";20 reps;Both  Step up and over   Forward Step Up Limitations 5 reps, CGA w/o AD; multiple LOB, able to self correct.   Functional Squat 5 reps;3 sets   Functional Squat Limitations from chair: 5x sit to stand   Other Standing Knee Exercises 3D hip excursions 20x  split stance (slight fwd stance); occas LOB, w/ self correct   Manual Therapy   Manual Therapy Joint mobilization                  PT Short Term Goals - 02/02/15 1008    PT SHORT TERM GOAL #1   Title Patient will dmeonstrate a DGI score of >10 to indicate improvign balance during gait.    Status On-going   PT SHORT TERM GOAL #2   Title Patient ill state decreased  frequency of vertigo symptoms to <1x daily and at and intensity <5/10   Status On-going   PT SHORT TERM GOAL #3   Title Patient will dmeosntrate independence of HEP.    Status On-going           PT Long Term Goals - 02/02/15 1008    PT LONG TERM GOAL #1   Title Patient will dmeonstrate a DGI score of >20 to indicate improving balance during gait and patient not at high risk of falls.    Status On-going   PT LONG TERM GOAL #2   Title Patient ill state decreased frequency of vertigo symptoms to <1x weekly and at and intensity <3/10   Status On-going   PT LONG TERM GOAL #3   Title Patient will state confidence in standing and sitting balance.    Status On-going   PT LONG TERM GOAL #4   Title Pt to be able to bend down and pick  objects off the floor with confidence   Status On-going               Plan - 02/02/15 1205    Clinical Impression Statement Session focused on increasing bilateral LE strenth and strability with use of standing exercises all perofrmed with therapist contact guard to increase bilateral LE strength and coordination to decrease risk of falls. Curb training was attempted up and down a 4" box but the box may have been too narrow resulting in patient unable to maintain balance indpendently twith cain due to abnormal placement.    PT Next Visit Plan Continue: Education on horizontal canolith repositioning manuever as needed, 5xsit to stand add weight, continue 3D hip excursions to split stance for balance training, continue tandem stance and progress to tandem gait and retro gait (non-tandem), increase step heigth to 6" for stair/curb training  and practice outside on curb for improved performance and carry over.         Problem List There are no active problems to display for this patient.  Jerilee Field PT DPT [phone]  [facility] [address] [place], [place], [address] Phone: [phone]   Fax:  [phone]

## 2015-02-09 ENCOUNTER — Ambulatory Visit (HOSPITAL_COMMUNITY): Payer: Medicare Other | Attending: Neurology

## 2015-02-09 DIAGNOSIS — Z9181 History of falling: Secondary | ICD-10-CM | POA: Diagnosis not present

## 2015-02-09 DIAGNOSIS — H540 Blindness, both eyes: Secondary | ICD-10-CM | POA: Diagnosis not present

## 2015-02-09 DIAGNOSIS — I10 Essential (primary) hypertension: Secondary | ICD-10-CM | POA: Insufficient documentation

## 2015-02-09 DIAGNOSIS — R262 Difficulty in walking, not elsewhere classified: Secondary | ICD-10-CM | POA: Diagnosis not present

## 2015-02-09 DIAGNOSIS — Z940 Kidney transplant status: Secondary | ICD-10-CM | POA: Diagnosis not present

## 2015-02-09 DIAGNOSIS — H8110 Benign paroxysmal vertigo, unspecified ear: Secondary | ICD-10-CM | POA: Diagnosis present

## 2015-02-09 DIAGNOSIS — Z9483 Pancreas transplant status: Secondary | ICD-10-CM | POA: Insufficient documentation

## 2015-02-09 NOTE — Therapy (Signed)
[place] [facility] [address] [place], [place], [address] Phone: [phone]   Fax:  [phone]  Physical Therapy Treatment  Patient Details  Name: Thomas GirtLarry J Oconnor MRN: [id number] Date of Birth: May 26, 1961 Referring Provider:  Corie Chiquitoooney, Jeffrey W, MD  Encounter Date: 02/09/2015      PT End of Session - 02/09/15 0916    Visit Number 5   Number of Visits 12   Date for PT Re-Evaluation 02/18/15   Authorization Type medicare   Authorization Time Period 12/19/14-6/123/16   Authorization - Visit Number 5   Authorization - Number of Visits 10   PT Start Time [phone]   PT Stop Time 0932   PT Time Calculation (min) 40 min   Equipment Utilized During Treatment Gait belt   Activity Tolerance Patient tolerated treatment well   Behavior During Therapy [facility] for tasks assessed/performed      Past Medical History  Diagnosis Date  . Renal disorder   . Hypertension   . Diabetes mellitus without complication   . Blood transfusion without reported diagnosis     Past Surgical History  Procedure Laterality Date  . Nephrectomy transplanted organ    . Hernia repair    . Abdominal surgery    . Combined kidney-pancreas transplant Bilateral     There were no vitals filed for this visit.  Visit Diagnosis:  Benign paroxysmal positional vertigo, unspecified laterality  At high risk for falls  Difficulty walking      Subjective Assessment - 02/09/15 0904    Subjective No current dizziness at entrance this session, feels like he has more energy today   Currently in Pain? No/denies             [facility] Adult PT Treatment/Exercise - 02/09/15 0001    Exercises   Exercises Knee/Hip   Knee/Hip Exercises: Standing   Forward Lunges Both;10 reps;2 sets   Forward Lunges Limitations // bars at CGA and  zero UE support   Side Lunges Both;2 sets;5 sets   Side Lunges Limitations CGA with multiple LOB req PT correction; 1st set w/ hands, 2nd set without  bilat.   Forward Step Up Both;10 reps;Hand Hold: 0;Step Height: 6"   Functional Squat 2 sets;5 reps;10 reps   Functional Squat Limitations 5 STS no HHA, 10 squats infront of chair   Gait Training tandem stance 4x 30" no HHA   Other Standing Knee Exercises 3D hip excursions 20x  split stance CGA             PT Short Term Goals - 02/09/15 1539    PT SHORT TERM GOAL #1   Title Patient will dmeonstrate a DGI score of >10 to indicate improvign balance during gait.    Status On-going   PT SHORT TERM GOAL #2   Title Patient ill state decreased frequency of vertigo symptoms to <1x daily and at and intensity <5/10   Status On-going   PT SHORT TERM GOAL #3   Title Patient will dmeosntrate independence of HEP.    Status On-going           PT Long Term Goals - 02/09/15 1539    PT LONG TERM GOAL #1   Title Patient will dmeonstrate a DGI score of >20 to indicate improving balance during gait and patient not at high risk of falls.    PT LONG TERM GOAL #2   Title Patient ill state decreased frequency of vertigo symptoms to <1x weekly and at and  intensity <3/10   PT LONG TERM GOAL #3   Title Patient will state confidence in standing and sitting balance.    PT LONG TERM GOAL #4   Title Pt to be able to bend down and pick objects off the floor with confidence   PT LONG TERM GOAL #5   Title Patient to be able to go up and down curbs without falling with a cain               Plan - 02/09/15 1536    Clinical Impression Statement Overall balance and stability are improving with minimal assistance required with tandem stance this session.  Able to increase step up height to 6in with cueing to reduce compensation due to weakness and assistance for balance instability  Pt limited by fatigue at end of session, minimal reports of dizziness this session.   PT Next Visit Plan Continue: Education on horizontal canolith repositioning manuever as needed, 5xsit to stand add weight, continue 3D  hip excursions to split stance for balance training, continue tandem stance and progress to tandem gait and retro gait (non-tandem), increase step heigth to 6" for stair/curb training  and practice outside on curb for improved performance and carry over.         Problem List There are no active problems to display for this patient.  [address] Bridle StreetCasey Cockerham, ClarkrangeLPTA; [place]LMBT #[id number] [phone]  Juel BurrowCockerham, Casey Jo 02/09/2015, 3:40 PM  [place] [facility] [address] [place], [place], [address] Phone: [phone]   Fax:  [phone]

## 2015-02-11 ENCOUNTER — Ambulatory Visit (HOSPITAL_COMMUNITY): Payer: Medicare Other | Admitting: Physical Therapy

## 2015-02-11 DIAGNOSIS — H8110 Benign paroxysmal vertigo, unspecified ear: Secondary | ICD-10-CM | POA: Diagnosis not present

## 2015-02-11 DIAGNOSIS — R262 Difficulty in walking, not elsewhere classified: Secondary | ICD-10-CM

## 2015-02-11 DIAGNOSIS — Z9181 History of falling: Secondary | ICD-10-CM

## 2015-02-11 NOTE — Therapy (Signed)
[place] [facility] [address] [place], [place], [address] Phone: [phone]   Fax:  [phone]  Physical Therapy Treatment  Patient Details  Name: Julien GirtLarry J Gallop MRN: [id number] Date of Birth: [birth year] Referring Provider:  Corie Chiquitoooney, Jeffrey W, MD  Encounter Date: 02/11/2015      PT End of Session - 02/11/15 1146    Visit Number 6   Number of Visits 12   Date for PT Re-Evaluation 02/18/15   Authorization Type medicare   Authorization Time Period 12/19/14-03/21/15   Authorization - Visit Number 6   Authorization - Number of Visits 10   PT Start Time 0930   PT Stop Time 1015   PT Time Calculation (min) 45 min   Activity Tolerance Patient tolerated treatment well   Behavior During Therapy [facility] for tasks assessed/performed      Past Medical History  Diagnosis Date  . Renal disorder   . Hypertension   . Diabetes mellitus without complication   . Blood transfusion without reported diagnosis     Past Surgical History  Procedure Laterality Date  . Nephrectomy transplanted organ    . Hernia repair    . Abdominal surgery    . Combined kidney-pancreas transplant Bilateral     There were no vitals filed for this visit.  Visit Diagnosis:  Benign paroxysmal positional vertigo, unspecified laterality  At high risk for falls  Difficulty walking      Subjective Assessment - 02/11/15 1138    Subjective Patient noted increased izziness upon arrival that began the day before and continued intermittently. As well as difficulty turning while walking.             [facility] Adult PT Treatment/Exercise - 02/11/15 0001    Knee/Hip Exercises: Standing   Forward Lunges Both;10 reps;2 sets   Forward Lunges Limitations // bars at [facility] and  zero UE support   Side Lunges Both;2 sets;5 sets   Side Lunges Limitations CGA with multiple LOB req PT correction; 1st set w/ hands, 2nd set without bilat.   Forward Step Up Both;2 sets;Hand Hold: 1;10  reps;Step Height: 6";Step Height: 8"   Functional Squat Limitations 10x STS no HHA, 10 squats infront of chair   Other Standing Knee Exercises transverseplane common lunge 10x   Other Standing Knee Exercises Reviewed: correction for Lt hoizontal cannalith pathology pt supine therapist holds head in slight flexion rotated to Right/slowly rotate head to left continuing to rotate as pt rolls prone; then pt goes to quadriped with therapist keeping head slightly flexed and to sitting)             PT Short Term Goals - 02/09/15 1539    PT SHORT TERM GOAL #1   Title Patient will dmeonstrate a DGI score of >10 to indicate improvign balance during gait.    Status On-going   PT SHORT TERM GOAL #2   Title Patient ill state decreased frequency of vertigo symptoms to <1x daily and at and intensity <5/10   Status On-going   PT SHORT TERM GOAL #3   Title Patient will dmeosntrate independence of HEP.    Status On-going           PT Long Term Goals - 02/09/15 1539    PT LONG TERM GOAL #1   Title Patient will dmeonstrate a DGI score of >20 to indicate improving balance during gait and patient not at high risk of falls.    PT LONG TERM GOAL #2  Title Patient ill state decreased frequency of vertigo symptoms to <1x weekly and at and intensity <3/10   PT LONG TERM GOAL #3   Title Patient will state confidence in standing and sitting balance.    PT LONG TERM GOAL #4   Title Pt to be able to bend down and pick objects off the floor with confidence   PT LONG TERM GOAL #5   Title Patient to be able to go up and down curbs without falling with a cain               Plan - 02/11/15 1147    Clinical Impression Statement Patient displays decreased balance and stability this session innitally secondary to likely BPPV episode that improved following performance of canolith repositioning manuever. Patient displayed improved visual tracking today with no eye pursuits evident following repositioning  manuever. Patient did display continuesd difficulties with lunges especially with newly introduced transverse plane lunges secondary to limited control in rotational plane of movmenmnt, however this did improve following manul and tactile cuing for increasing hip and trunk activation.  Steps progressed to 8" with patient noting particular weakness in Lt hamstring and R hip abductors.    PT Next Visit Plan Continue: Education on horizontal canolith repositioning manuever as needed, 5xsit to stand add weight (10lb), Progress 3D hip excursions to split stance for balance training, tandem gait and retro gait (non-tandem), continue step heigth to 8" for stair/curb training  and practice outside on curb for improved performance and carry over when weather improves.         Problem List There are no active problems to display for this patient.   Doyne KeelDeWitt, Emberlie Gotcher R 02/11/2015, 11:53 AM  [place] [facility] [place] [facility] [address] [place], [place], [address] Phone: [phone]   Fax:  [phone]

## 2015-02-16 ENCOUNTER — Ambulatory Visit (HOSPITAL_COMMUNITY): Payer: Medicare Other | Admitting: Physical Therapy

## 2015-02-16 DIAGNOSIS — H8110 Benign paroxysmal vertigo, unspecified ear: Secondary | ICD-10-CM | POA: Diagnosis not present

## 2015-02-16 DIAGNOSIS — Z9181 History of falling: Secondary | ICD-10-CM

## 2015-02-16 DIAGNOSIS — R262 Difficulty in walking, not elsewhere classified: Secondary | ICD-10-CM

## 2015-02-16 NOTE — Therapy (Signed)
[facility] [address] [place], [place], [address] Phone: [phone]   Fax:  [phone]  Physical Therapy Treatment  Patient Details  Name: Thomas Oconnor MRN: [id number] Date of Birth: July 04, 1961 Referring Provider:  Amanda Pea, MD  Encounter Date: 02/16/2015      PT End of Session - 02/16/15 1038    Visit Number 7   Number of Visits 12   Date for PT Re-Evaluation 03/19/15   Authorization Type medicare   Authorization Time Period 12/19/14-03/21/15 Gcodes updated on 7th visit   Authorization - Visit Number 7   Authorization - Number of Visits 17   PT Start Time 0915   PT Stop Time 1025   PT Time Calculation (min) 70 min   Equipment Utilized During Treatment Gait belt   Activity Tolerance Patient tolerated treatment well   Behavior During Therapy [facility] for tasks assessed/performed      Past Medical History  Diagnosis Date  . Renal disorder   . Hypertension   . Diabetes mellitus without complication   . Blood transfusion without reported diagnosis     Past Surgical History  Procedure Laterality Date  . Nephrectomy transplanted organ    . Hernia repair    . Abdominal surgery    . Combined kidney-pancreas transplant Bilateral     There were no vitals filed for this visit.  Visit Diagnosis:  Benign paroxysmal positional vertigo, unspecified laterality  At high risk for falls  Difficulty walking      Subjective Assessment - 02/16/15 0934    Subjective Patient noted 1 moment of vertigo in last 24 hours and did not remember any before that since last session.patient notes no pain but continues to hav balance difficulties with stairs            [facility] PT Assessment - 02/16/15 0001    Berg Balance Test   Sit to Stand Able to stand without using hands and stabilize independently   Standing Unsupported Able to stand safely 2 minutes   Sitting with Back Unsupported but Feet Supported on Floor or Stool Able to  sit safely and securely 2 minutes   Stand to Sit Sits safely with minimal use of hands   Transfers Able to transfer safely, definite need of hands   Standing Unsupported with Eyes Closed Able to stand 10 seconds safely   Standing Ubsupported with Feet Together Able to place feet together independently and stand for 1 minute with supervision   From Standing, Reach Forward with Outstretched Arm Can reach confidently >25 cm (10")   From Standing Position, Pick up Object from Floor Able to pick up shoe, needs supervision   From Standing Position, Turn to Look Behind Over each Shoulder Looks behind one side only/other side shows less weight shift   Turn 360 Degrees Needs assistance while turning   Standing Unsupported, Alternately Place Feet on Step/Stool Needs assistance to keep from falling or unable to try   Standing Unsupported, One Foot in Front Able to take small step independently and hold 30 seconds   Standing on One Leg Tries to lift leg/unable to hold 3 seconds but remains standing independently   Total Score 39   Dynamic Gait Index   Level Surface Moderate Impairment   Change in Gait Speed Moderate Impairment   Gait with Horizontal Head Turns Moderate Impairment   Gait with Vertical Head Turns Moderate Impairment   Gait and Pivot Turn Moderate Impairment   Step Over  Obstacle Severe Impairment   Step Around Obstacles Moderate Impairment   Steps Moderate Impairment   Total Score 7                     [facility] Adult PT Treatment/Exercise - 02/16/15 0001    Knee/Hip Exercises: Aerobic   Stationary Bike nustep 5 minutes   Knee/Hip Exercises: Standing   Heel Raises 20 reps   Heel Raises Limitations heel-toe raises   Knee Flexion Limitations mini-squat overhead dumbbell matrix 5x each no opposit-side overhead and sagittal plane overhead reaches with 5lb dumbbells   Forward Lunges Both;2 sets;5 reps   Forward Lunges Limitations // bars at CGA and  zero UE support   Side  Lunges Both;2 sets;5 reps   Side Lunges Limitations CGA with multiple LOB req PT correction; 1st set w/ hands, 2nd set without bilat.   Forward Step Up Both;2 sets;Hand Hold: 1;10 reps;Step Height: 6"   Step Down 2 sets;Step Height: 6";Hand Hold: 2   Functional Squat Limitations 5x 3 sets STS no HHA, 1 set 1x 10lb dumbbell, 2nd set 2x 10lb dumbbells, 10x sit to astand   Other Standing Knee Exercises transverseplane common lunge 10x split stance 3D hip excusions   Other Standing Knee Exercises 20lb dead lift 2ses of 10 reps   Knee/Hip Exercises: Supine   Other Supine Knee Exercises Reviewed: correction for Lt hoizontal cannalith pathology pt supine therapist holds head in slight flexion rotated to Right/slowly rotate head to left continuing to rotate as pt rolls prone; then pt goes to quadriped with therapist keeping head slightly flexed and to sitting)    Big reach walk 5fet with same side rotation only.               PT Short Term Goals - 02/16/15 1039    PT SHORT TERM GOAL #1   Title Patient will dmeonstrate a DGI score of >10 to indicate improvign balance during gait.    Status On-going   PT SHORT TERM GOAL #2   Title Patient ill state decreased frequency of vertigo symptoms to <1x daily and at and intensity <5/10   Status Achieved   PT SHORT TERM GOAL #3   Title Patient will dmeosntrate independence of HEP.    Status Partially Met           PT Long Term Goals - 02/16/15 1039    PT LONG TERM GOAL #1   Title Patient will dmeonstrate a DGI score of >20 to indicate improving balance during gait and patient not at high risk of falls.    Status On-going   PT LONG TERM GOAL #2   Title Patient ill state decreased frequency of vertigo symptoms to <1x weekly and at and intensity <3/10   Status On-going   PT LONG TERM GOAL #3   Title Patient will state confidence in standing and sitting balance.    Status On-going   PT LONG TERM GOAL #4   Title Pt to be able to bend down  and pick objects off the floor with confidence   Status On-going   PT LONG TERM GOAL #5   Title Patient to be able to go up and down curbs without falling with a cain   Time 4   Period Weeks   Status On-going               Plan - 02/16/15 1034    Clinical Impression Statement Session focused predominantly on Le strengthening to  improve LE stepping reactions with loss of balance. Overhead dumbbell matrix given to increase LE with UE coordination and reachign outside of base of support. Canolith repositioning manuever reviewed to decrease patient vertigo symptoms that were particularly present during big reach walks and rotational based exercises. Reassessment performed  with patient dispalsying improving balance per improvemnts in DGI and Berg balance score, notd continued weakness in LEs and difficulty with single elg and walking tasks.    PT Next Visit Plan Continue: Education on horizontal canolith repositioning manuever as neededprogress weighted dead lifts to 30lb, introduce squat reach matrix (give as HEP) to chair to further challenge rotational strength/coordination.           G-Codes - 03-04-15 1036    Functional Assessment Tool Used FOTO 60% limited   Functional Limitation Mobility: Walking and moving around   Mobility: Walking and Moving Around Current Status [phone]) At least 40 percent but less than 60 percent impaired, limited or restricted   Mobility: Walking and Moving Around Goal Status [phone]) At least 20 percent but less than 40 percent impaired, limited or restricted      Problem List There are no active problems to display for this patient.  Devona Konig PT DPT [facility] [address] [place], [place], [address] Phone: [phone]   Fax:  [phone]

## 2015-02-18 ENCOUNTER — Ambulatory Visit (HOSPITAL_COMMUNITY): Payer: Medicare Other

## 2015-02-18 VITALS — BP 156/76 | HR 79

## 2015-02-18 DIAGNOSIS — Z9181 History of falling: Secondary | ICD-10-CM

## 2015-02-18 DIAGNOSIS — R262 Difficulty in walking, not elsewhere classified: Secondary | ICD-10-CM

## 2015-02-18 DIAGNOSIS — H8110 Benign paroxysmal vertigo, unspecified ear: Secondary | ICD-10-CM

## 2015-02-18 NOTE — Therapy (Signed)
[place], [place], [address] Phone: [phone]   Fax:  [phone]  Physical Therapy Treatment  Patient Details  Name: Thomas Oconnor MRN: [id number] Date of Birth: Feb 03, 1961 Referring Provider:  Amanda Pea, MD  Encounter Date: 02/18/2015      PT End of Session - 02/18/15 1005    Visit Number 8   Number of Visits 12   Date for PT Re-Evaluation 03/19/15   Authorization Type medicare   Authorization Time Period 12/19/14-03/21/15 Gcodes updated on 7th visit   Authorization - Visit Number 8   Authorization - Number of Visits 17   PT Start Time 0932   PT Stop Time 1013   PT Time Calculation (min) 41 min   Activity Tolerance Patient tolerated treatment well;Treatment limited secondary to medical complications (Comment)  Reports of lightheadedness, increased BP at entrance   Behavior During Therapy [facility] for tasks assessed/performed      Past Medical History  Diagnosis Date  . Renal disorder   . Hypertension   . Diabetes mellitus without complication   . Blood transfusion without reported diagnosis     Past Surgical History  Procedure Laterality Date  . Nephrectomy transplanted organ    . Hernia repair    . Abdominal surgery    . Combined kidney-pancreas transplant Bilateral     Filed Vitals:   02/18/15 0932 02/18/15 0946 02/18/15 0959  BP: 171/86 161/86 156/76  Pulse:  75 79    Visit Diagnosis:  Benign paroxysmal positional vertigo, unspecified laterality  At high risk for falls  Difficulty walking      Subjective Assessment - 02/18/15 0944    Subjective Pt entered session c/o lightheadedness and increased dizziness.   Currently in Pain? No/denies            [facility] Adult PT Treatment/Exercise - 02/18/15 0001    Knee/Hip Exercises: Standing   Heel Raises 20 reps   Heel Raises Limitations heel-toe raises   Forward Lunges Both;2 sets;5 reps   Forward Lunges Limitations // bars  at CGA and  zero UE support   Functional Squat 2 sets;10 reps   Knee/Hip Exercises: Seated   Long Arc Quad Both;2 sets;10 reps   Other Seated Knee Exercises ankle pumps2x10; seated marching 2x10   Other Seated Knee Exercises Hallpike Dix manuever to Rt and LT; + Lt with horizontal nystagmus.             PT Education - 02/18/15 1757    Education provided Yes   Education Details Pt encouraged to call MD if BP continues to run so high with reports of ligthheadedness   Person(s) Educated Patient   Methods Explanation   Comprehension Verbalized understanding;Returned demonstration          PT Short Term Goals - 02/18/15 1012    PT SHORT TERM GOAL #1   Title Patient will dmeonstrate a DGI score of >10 to indicate improvign balance during gait.    Status On-going   PT SHORT TERM GOAL #2   Title Patient ill state decreased frequency of vertigo symptoms to <1x daily and at and intensity <5/10   Status Achieved   PT SHORT TERM GOAL #3   Title Patient will dmeosntrate independence of HEP.    Status Partially Met           PT Long Term Goals - 02/18/15 1756    PT LONG TERM GOAL #1   Title Patient will  dmeonstrate a DGI score of >20 to indicate improving balance during gait and patient not at high risk of falls.    PT LONG TERM GOAL #2   Title Patient ill state decreased frequency of vertigo symptoms to <1x weekly and at and intensity <3/10   PT LONG TERM GOAL #3   Title Patient will state confidence in standing and sitting balance.    PT LONG TERM GOAL #4   Title Pt to be able to bend down and pick objects off the floor with confidence   PT LONG TERM GOAL #5   Title Patient to be able to go up and down curbs without falling with a cain               Plan - 02/18/15 1534    Clinical Impression Statement Regressed standing exercises due to reports of lightheadedness at entrance to dept.  BP at entrance was 171/80 mmHg with HR 73.  Pt stated he had taken BP medicaiton  this morning.  Completed seated LE strenghtneing exercises and BP taken  throughtout session.  BP reduced to 139/90 and light standing strengtheing exercises were complete.  End of session pt reports no dizziness of lightheadedness.   PT Next Visit Plan Continue: Education on horizontal canolith repositioning manuever as neededprogress weighted dead lifts to 30lb, introduce squat reach matrix (give as HEP) to chair to further challenge rotational strength/coordination.   Complete BP manually.as needed with reports of s/s.          Problem List There are no active problems to display for this patient.  [address] [place]; [place] #[id number] [phone]  Aldona Lento 02/18/2015, 5:59 PM  [place] [address] [place], [place], [address] Phone: [phone]   Fax:  [phone]

## 2015-02-23 ENCOUNTER — Ambulatory Visit (HOSPITAL_COMMUNITY): Payer: Medicare Other | Admitting: Physical Therapy

## 2015-02-23 ENCOUNTER — Ambulatory Visit (HOSPITAL_COMMUNITY): Payer: Medicare Other

## 2015-02-23 DIAGNOSIS — Z9181 History of falling: Secondary | ICD-10-CM

## 2015-02-23 DIAGNOSIS — H8110 Benign paroxysmal vertigo, unspecified ear: Secondary | ICD-10-CM | POA: Diagnosis not present

## 2015-02-23 DIAGNOSIS — R262 Difficulty in walking, not elsewhere classified: Secondary | ICD-10-CM

## 2015-02-23 NOTE — Therapy (Addendum)
[facility] [address] [place], [place], [address] Phone: [phone]   Fax:  [phone]  Physical Therapy Treatment  Patient Details  Name: Thomas Oconnor MRN: [id number] Date of Birth: [birth year] Referring Provider:  Claiborne RiggSmith, Stephen R, MD  Encounter Date: 02/23/2015      PT End of Session - 02/23/15 1004    Visit Number 9   Number of Visits 12   Date for PT Re-Evaluation 03/19/15   Authorization Type medicare   Authorization Time Period 12/19/14-03/21/15 Gcodes updated on 7th visit   Authorization - Visit Number 9   Authorization - Number of Visits 17   PT Start Time 0935   PT Stop Time 1028   PT Time Calculation (min) 53 min   Equipment Utilized During Treatment Gait belt   Activity Tolerance Patient tolerated treatment well   [facility] for tasks assessed/performed      Past Medical History  Diagnosis Date  . Renal disorder   . Hypertension   . Diabetes mellitus without complication   . Blood transfusion without reported diagnosis     Past Surgical History  Procedure Laterality Date  . Nephrectomy transplanted organ    . Hernia repair    . Abdominal surgery    . Combined kidney-pancreas transplant Bilateral     There were no vitals filed for this visit.  Visit Diagnosis:  Benign paroxysmal positional vertigo, unspecified laterality  At high risk for falls  Difficulty walking      Subjective Assessment - 02/23/15 0937    Subjective Pt stated no dizziness and checked BP when he got home following last session 120/56, has apt with [facility].     Currently in Pain? No/denies            [facility]/Ex[facility] - 02/23/15 0001    Knee/Hip Exercises: Aerobic   Stationary Bike nustep hill level 4 resistance 3 x 10 min   Knee/Hip Exercises: Standing   Heel Raises 20 reps   Heel Raises Limitations heel-toe raises   Forward Lunges Both;10 reps   Forward Lunges Limitations common  lunge matrix in // [facility] and  zero UE support   Functional Squat 5 sets   Functional Squat Limitations squat reach matrix with 3#   Stairs 5RT ascending 7in and descending 4in 1 HR   Other Standing Knee Exercises 3D ankle excursion 10x Bil LE   Other Standing Knee Exercises 20lb dead lift 15reps             PT Education - 02/23/15 1406    Education provided Yes   Education Details Given advanced HEP   Person(s) Educated Patient   Methods Explanation;Demonstration;Handout   Comprehension Verbalized understanding;Returned demonstration          PT Short Term Goals - 02/23/15 1226    PT SHORT TERM GOAL #1   Title Patient will dmeonstrate a DGI score of >10 to indicate improvign balance during gait.    Status On-going   PT SHORT TERM GOAL #2   Title Patient ill state decreased frequency of vertigo symptoms to <1x daily and at and intensity <5/10   Status Achieved   PT SHORT TERM GOAL #3   Title Patient will dmeosntrate independence of HEP.    Status On-going           PT Long Term Goals - 02/23/15 1226    PT LONG TERM GOAL #1   Title Patient will  dmeonstrate a DGI score of >20 to indicate improving balance during gait and patient not at high risk of falls.    Status On-going   PT LONG TERM GOAL #2   Title Patient ill state decreased frequency of vertigo symptoms to <1x weekly and at and intensity <3/10   PT LONG TERM GOAL #3   Title Patient will state confidence in standing and sitting balance.    Status On-going   PT LONG TERM GOAL #4   Title Pt to be able to bend down and pick objects off the floor with confidence   Status On-going   PT LONG TERM GOAL #5   Title Patient to be able to go up and down curbs without falling with a cain   Status On-going               Plan - 02/23/15 1207    Clinical Impression Statement Progressed functional strengthening with minimal HHA to improve balance.  Pt able to demonstrate approriate mechanics with proper  lifting techniques.  Began squat reach matrix for gltueal strengthening and to challenge balance with rotational strengthening and coordination, pt given prinout to add to HEP.  No reports of lightheadedness or dizziness through session.     PT Next Visit Plan Continue: Education on horizontal canolith repositioning manuever as needed.  Progress weighted dead lifts to 30lb, review squat reach matrix to chair to further challenge rotational strength/coordination.   Complete BP manually.as needed with reports of s/s.          Problem List There are no active problems to display for this patient.  [address]Casey [place]ockerham, [place]; [place] #[id number] [phone]  Juel BurrowCockerham, Casey Jo 02/23/2015, 2:06 PM  [facility] [address] [place], [place], [address] Phone: [phone]   Fax:  [phone]

## 2015-02-23 NOTE — Patient Instructions (Signed)
Squat Knee High Reach Matrix

## 2015-02-25 ENCOUNTER — Ambulatory Visit (HOSPITAL_COMMUNITY): Payer: Medicare Other | Admitting: Physical Therapy

## 2015-02-25 DIAGNOSIS — H8110 Benign paroxysmal vertigo, unspecified ear: Secondary | ICD-10-CM | POA: Diagnosis not present

## 2015-02-25 DIAGNOSIS — Z9181 History of falling: Secondary | ICD-10-CM

## 2015-02-25 DIAGNOSIS — R262 Difficulty in walking, not elsewhere classified: Secondary | ICD-10-CM

## 2015-02-25 NOTE — Therapy (Signed)
[place], [place], [address] Phone: [phone]   Fax:  [phone]  Physical Therapy Treatment/Reassessment  Patient Details  Name: Thomas Oconnor MRN: [id number] Date of Birth: September 28, 1961 Referring Provider:  Amanda Pea, MD  Encounter Date: 02/25/2015      PT End of Session - 02/25/15 1156    Visit Number 10   Number of Visits 18   Date for PT Re-Evaluation 03/19/15   Authorization Type medicare   Authorization Time Period 12/19/14-03/21/15 Gcodes updated on 10th visit.    Authorization - Visit Number 10   Authorization - Number of Visits 20   PT Start Time [phone]   PT Stop Time 1016   PT Time Calculation (min) 43 min   Equipment Utilized During Treatment Gait belt   Activity Tolerance Patient tolerated treatment well   Behavior During Therapy WFL for tasks assessed/performed      Past Medical History  Diagnosis Date  . Renal disorder   . Hypertension   . Diabetes mellitus without complication   . Blood transfusion without reported diagnosis     Past Surgical History  Procedure Laterality Date  . Nephrectomy transplanted organ    . Hernia repair    . Abdominal surgery    . Combined kidney-pancreas transplant Bilateral     There were no vitals filed for this visit.  Visit Diagnosis:  Benign paroxysmal positional vertigo, unspecified laterality  At high risk for falls  Difficulty walking      Subjective Assessment - 02/25/15 1152    Subjective Patient states no dizziness today, nots sometimes he feels more balanced and sometimes he feels like he is not making progress. no dizziness last 48 hours            [facility] PT Assessment - 02/25/15 0001    Assessment   Medical Diagnosis Vertigo   Onset Date 08/22/14   Next MD Visit Yetta Flock   Prior Therapy no   Observation/Other Assessments   Focus on Therapeutic Outcomes (FOTO)  32% limited, was 45% limited   Strength   Right  Hip Flexion 4/5   Right Hip Extension 3/5   Right Hip ABduction 2+/5   Left Hip Flexion 4/5   Left Hip Extension 3/5   Left Hip ABduction 2+/5   Right Knee Flexion 5/5   Right Knee Extension 4+/5   Left Knee Flexion 4+/5   Left Knee Extension 4+/5   Right Ankle Dorsiflexion 4/5   Left Ankle Dorsiflexion 4/5   Berg Balance Test   Sit to Stand Able to stand without using hands and stabilize independently   Standing Unsupported Able to stand safely 2 minutes   Sitting with Back Unsupported but Feet Supported on Floor or Stool Able to sit safely and securely 2 minutes   Stand to Sit Sits safely with minimal use of hands   Transfers Able to transfer safely, minor use of hands   Standing Unsupported with Eyes Closed Able to stand 10 seconds safely   Standing Ubsupported with Feet Together Able to place feet together independently and stand for 1 minute with supervision   From Standing, Reach Forward with Outstretched Arm Can reach confidently >25 cm (10")   From Standing Position, Pick up Object from Floor Able to pick up shoe, needs supervision   From Standing Position, Turn to Look Behind Over each Shoulder Looks behind from both sides and weight shifts well   Turn 360 Degrees Needs  close supervision or verbal cueing   Standing Unsupported, Alternately Place Feet on Step/Stool Able to complete >2 steps/needs minimal assist   Standing Unsupported, One Foot in [place] to take small step independently and hold 30 seconds   Standing on One Leg Tries to lift leg/unable to hold 3 seconds but remains standing independently   Total Score 43   Dynamic Gait Index   Level Surface Mild Impairment   Change in Gait Speed Moderate Impairment   Gait with Horizontal Head Turns Moderate Impairment   Gait with Vertical Head Turns Moderate Impairment   Gait and Pivot Turn Moderate Impairment   Step Over Obstacle Moderate Impairment   Step Around Obstacles Moderate Impairment   Steps Moderate  Impairment   Total Score 9                     [facility] Adult PT Treatment/Exercise - 02/25/15 0001    Knee/Hip Exercises: Stretches   Hip Flexor Stretch Limitations 10x 3 seconds to 14" box in // bars   Knee/Hip Exercises: Aerobic   Stationary Bike nustep hill level 4 resistance 4 x 10 min   Knee/Hip Exercises: Standing   Heel Raises 20 reps   Heel Raises Limitations heel-toe raises   Forward Lunges Both;10 reps   Forward Lunges Limitations common lunge matrix in // bars at [facility] and  zero UE support   Forward Step Up Both;2 sets;Hand Hold: 1;10 reps;Step Height: 6"   Step Down 2 sets;Step Height: 6";Hand Hold: 2   Functional Squat 5 sets   Functional Squat Limitations squat reach matrix with 3#   Other Standing Knee Exercises 3D ankle excursion 10x Bil LE 3D hip excursions 10x    Other Standing Knee Exercises 25lb dead lift 2 sets of 10 reps                  PT Short Term Goals - 02/25/15 1211    PT SHORT TERM GOAL #1   Title Patient will dmeonstrate a DGI score of >10 to indicate improvign balance during gait.    Status On-going   PT SHORT TERM GOAL #2   Title Patient ill state decreased frequency of vertigo symptoms to <1x daily and at and intensity <5/10   Status Achieved   PT SHORT TERM GOAL #3   Title Patient will dmeosntrate independence of HEP.    Status Achieved           PT Long Term Goals - 02/25/15 1211    PT LONG TERM GOAL #1   Title Patient will dmeonstrate a DGI score of >20 to indicate improving balance during gait and patient not at high risk of falls.    Status On-going   PT LONG TERM GOAL #2   Title Patient ill state decreased frequency of vertigo symptoms to <1x weekly and at and intensity <3/10   Status On-going   PT LONG TERM GOAL #3   Title Patient will state confidence in standing and sitting balance.    Status Partially Met   PT LONG TERM GOAL #4   Title Pt to be able to bend down and pick objects off the floor with  confidence   Status On-going   PT LONG TERM GOAL #5   Title Patient to be able to go up and down curbs without falling with a cain   Status On-going               Plan - 02/25/15  1204    Clinical Impression Statement Reassessment performed with patient demosntrating increased balance according to BERG balance score, DGI, and 5x sit to stand scores. Patient has gone 48 hours withotu a bout of vertigo and notes no e feels his balance is getting better according to his improved FOTO score. Patient will contrinue to benefit from skilled physical therapy with a continued focus on functional strengthening and balance exercises to enhance LE strength and coordination.  patient continues to display bilateral glut med and max weakness resulting in unstable gait for which therapy will increase focus   Pt will benefit from skilled therapeutic intervention in order to improve on the following deficits Abnormal gait;Decreased endurance;Improper body mechanics;Postural dysfunction;Pain;Impaired flexibility;Decreased strength;Decreased activity tolerance;Difficulty walking;Decreased balance;Impaired sensation   PT Frequency 2x / week   PT Duration 4 weeks   PT Treatment/Interventions Neuromuscular re-education;Stair training;ADLs/Self Care Home Management;Patient/family education;Therapeutic activities;Therapeutic exercise;Manual techniques;Balance training   PT Next Visit Plan Continue: Education on horizontal canolith repositioning manuever PRN.  Progress weighted dead lifts to 30lb, Continuesquat reach matrix to chair to further challenge rotational strength/coordination.  Introduce Sumo walking and introduce 3D step ups onto 4" box   Consulted and Agree with Plan of Care Patient          G-Codes - Mar 16, 2015 1210-12-29    Functional Assessment Tool Used FOTO 32% limited   Functional Limitation Mobility: Walking and moving around   Mobility: Walking and Moving Around Current Status [phone]) At least 20  percent but less than 40 percent impaired, limited or restricted   Mobility: Walking and Moving Around Goal Status [phone]) At least 1 percent but less than 20 percent impaired, limited or restricted      Problem List There are no active problems to display for this patient.  Devona Konig PT DPT [place] [address] [place], [place], [address] Phone: [phone]   Fax:  [phone]

## 2015-03-10 ENCOUNTER — Encounter (HOSPITAL_COMMUNITY): Payer: Medicare Other

## 2015-03-16 ENCOUNTER — Ambulatory Visit (HOSPITAL_COMMUNITY): Payer: Medicare Other | Attending: Neurology

## 2015-03-16 DIAGNOSIS — H540 Blindness, both eyes: Secondary | ICD-10-CM | POA: Insufficient documentation

## 2015-03-16 DIAGNOSIS — Z9181 History of falling: Secondary | ICD-10-CM

## 2015-03-16 DIAGNOSIS — Z9483 Pancreas transplant status: Secondary | ICD-10-CM | POA: Insufficient documentation

## 2015-03-16 DIAGNOSIS — Z940 Kidney transplant status: Secondary | ICD-10-CM | POA: Insufficient documentation

## 2015-03-16 DIAGNOSIS — H8110 Benign paroxysmal vertigo, unspecified ear: Secondary | ICD-10-CM

## 2015-03-16 DIAGNOSIS — I10 Essential (primary) hypertension: Secondary | ICD-10-CM | POA: Diagnosis not present

## 2015-03-16 DIAGNOSIS — R262 Difficulty in walking, not elsewhere classified: Secondary | ICD-10-CM

## 2015-03-16 NOTE — Therapy (Signed)
[place], [place], [address] Phone: [phone]   Fax:  [phone]  Physical Therapy Treatment  Patient Details  Name: Thomas Oconnor MRN: [id number] Date of Birth: 1961/06/10 Referring Provider:  Amanda Pea, MD  Encounter Date: 03/16/2015      PT End of Session - 03/16/15 0940    Visit Number 11   Number of Visits 18   Date for PT Re-Evaluation 03/19/15   Authorization Type medicare   Authorization Time Period 12/19/14-03/21/15 Gcodes updated on 10th visit.    Authorization - Visit Number 11   Authorization - Number of Visits 20   PT Start Time 0321   PT Stop Time 1020   PT Time Calculation (min) 45 min   Activity Tolerance Patient tolerated treatment well   Behavior During Therapy WFL for tasks assessed/performed      Past Medical History  Diagnosis Date  . Renal disorder   . Hypertension   . Diabetes mellitus without complication   . Blood transfusion without reported diagnosis     Past Surgical History  Procedure Laterality Date  . Nephrectomy transplanted organ    . Hernia repair    . Abdominal surgery    . Combined kidney-pancreas transplant Bilateral     There were no vitals filed for this visit.  Visit Diagnosis:  Benign paroxysmal positional vertigo, unspecified laterality  At high risk for falls  Difficulty walking      Subjective Assessment - 03/16/15 0938    Subjective Pt stated minimal dizziness today, reports went to MD and had BP checked and stated BP less when not dizzy   Currently in Pain? No/denies                 [facility] Adult PT Treatment/Exercise - 03/16/15 0001    Knee/Hip Exercises: Stretches   Hip Flexor Stretch Limitations 10x 3 seconds to 14" box in // bars   Knee/Hip Exercises: Aerobic   Stationary Bike nustep hill level 4 resistance 5 x 10 min   Knee/Hip Exercises: Standing   Heel Raises 20 reps   Heel Raises Limitations heel-toe raises   Forward Lunges Both;10 reps   Forward Lunges Limitations common lunge matrix in // bars at [facility] and  zero UE support   Lateral Step Up Both;10 reps;Hand Hold: 2;Step Height: 4"   Forward Step Up Both;2 sets;Hand Hold: 1;10 reps;Step Height: 6"   Step Down 2 sets;Step Height: 6";Hand Hold: 2   Functional Squat 5 sets   Functional Squat Limitations squat reach matrix with 5#   Other Standing Knee Exercises 3D ankle excursion 10x Bil LE 3D hip excursions 10x    Other Standing Knee Exercises Sumowalking 2RT                  PT Short Term Goals - 03/16/15 1218    PT SHORT TERM GOAL #1   Title Patient will dmeonstrate a DGI score of >10 to indicate improvign balance during gait.    Status On-going   PT SHORT TERM GOAL #2   Title Patient ill state decreased frequency of vertigo symptoms to <1x daily and at and intensity <5/10   Status Achieved   PT SHORT TERM GOAL #3   Title Patient will dmeosntrate independence of HEP.    Status Achieved           PT Long Term Goals - 03/16/15 1218    PT LONG TERM GOAL #1  Title Patient will dmeonstrate a DGI score of >20 to indicate improving balance during gait and patient not at high risk of falls.    Status On-going   PT LONG TERM GOAL #2   Title Patient ill state decreased frequency of vertigo symptoms to <1x weekly and at and intensity <3/10   Status On-going   PT LONG TERM GOAL #3   Title Patient will state confidence in standing and sitting balance.    Status Partially Met   PT LONG TERM GOAL #4   Title Pt to be able to bend down and pick objects off the floor with confidence   Status On-going   PT LONG TERM GOAL #5   Title Patient to be able to go up and down curbs without falling with a cain   Status On-going               Plan - 03/16/15 1214    Clinical Impression Statement Continued treatment focus on improving functional strengthening and overall stabiltiy with gait.  Added sumowalking for gluteal strenghtening  and to improve balance and stability with dynamic movements.  Increased weight with squat reach matrix to improve gluteal and core strengthening with coordination and rotational movements.  Noted weak eccentric control descending stairs with 2 HHA required to reduce risk of falls and cueing to conttrol descending.  Ended session with Nustep with increased resistance, pt limited by fatigue.  No reports of pain or dizziness through session.     PT Next Visit Plan Continue: Education on horizontal canolith repositioning manuever PRN.  Progress weighted dead lifts to 30lb, Continuesquat reach matrix to chair to further challenge rotational strength/coordination.  Introduce Sumo walking and introduce 3D step ups onto 4" box        Problem List There are no active problems to display for this patient.  Aldona Lento, PTA  Aldona Lento 03/16/2015, 12:22 PM  [place] [address] [place], [place], [address] Phone: [phone]   Fax:  [phone]

## 2015-03-17 ENCOUNTER — Encounter (HOSPITAL_COMMUNITY): Payer: Medicare Other

## 2015-03-23 ENCOUNTER — Encounter (HOSPITAL_COMMUNITY): Payer: Medicare Other | Admitting: Physical Therapy

## 2015-03-25 ENCOUNTER — Encounter (HOSPITAL_COMMUNITY): Payer: Medicare Other

## 2015-03-30 ENCOUNTER — Ambulatory Visit (HOSPITAL_COMMUNITY): Payer: Medicare Other | Admitting: Physical Therapy

## 2015-03-30 DIAGNOSIS — Z9181 History of falling: Secondary | ICD-10-CM

## 2015-03-30 DIAGNOSIS — R262 Difficulty in walking, not elsewhere classified: Secondary | ICD-10-CM

## 2015-03-30 DIAGNOSIS — H8110 Benign paroxysmal vertigo, unspecified ear: Secondary | ICD-10-CM | POA: Diagnosis not present

## 2015-03-30 NOTE — Therapy (Signed)
[place], [place], [address] Phone: [phone]   Fax:  [phone]  Physical Therapy Treatment  Patient Details  Name: Thomas Oconnor MRN: [id number] Date of Birth: July 09, 1961 Referring Provider:  Elijio Miles, MD  Encounter Date: 03/30/2015      PT End of Session - 03/30/15 1150    Visit Number 12   Number of Visits 18   Date for PT Re-Evaluation 04/01/15   Authorization Type medicare   Authorization Time Period 12/19/14-03/21/15 Gcodes updated on 10th visit.    Authorization - Visit Number 12   Authorization - Number of Visits 20   PT Start Time 0254   PT Stop Time 1146   PT Time Calculation (min) 47 min   Equipment Utilized During Treatment Gait belt   Activity Tolerance Patient tolerated treatment well   Behavior During Therapy WFL for tasks assessed/performed      Past Medical History  Diagnosis Date  . Renal disorder   . Hypertension   . Diabetes mellitus without complication   . Blood transfusion without reported diagnosis     Past Surgical History  Procedure Laterality Date  . Nephrectomy transplanted organ    . Hernia repair    . Abdominal surgery    . Combined kidney-pancreas transplant Bilateral     There were no vitals filed for this visit.  Visit Diagnosis:  At high risk for falls  Difficulty walking      Subjective Assessment - 03/30/15 1101    Subjective Pt reports that he has experienced dizziness on 3 occasions since the last time that he was here. He felt dizzy yesterday, but his blood pressure was low.    Currently in Pain? No/denies            [facility] Adult PT Treatment/Exercise - 03/30/15 0001    Knee/Hip Exercises: Standing   Heel Raises 20 reps   Heel Raises Limitations heel-toe raises   Forward Lunges Both;10 reps   Forward Lunges Limitations // bars with BUE minimal support to maintain upright posture   Side Lunges 1 set;10 reps   Lateral Step Up  Both;10 reps;Hand Hold: 2;Step Height: 6"   Forward Step Up Both;2 sets;Hand Hold: 1;10 reps;Step Height: 6"   Step Down 1 set;10 reps;Step Height: 6";Hand Hold: 2   SLS tap up at box x 10 bilaterally with no UE support   Gait Training walking with horizontal and vertical head turns x 2 RT each   Other Standing Knee Exercises Sumo walking x 2 RT            PT Education - 03/30/15 1149    Education provided Yes   Education Details Educated on proper posture during functional activities.    Person(s) Educated Patient   Methods Explanation   Comprehension Verbalized understanding          PT Short Term Goals - 03/16/15 1218    PT SHORT TERM GOAL #1   Title Patient will dmeonstrate a DGI score of >10 to indicate improvign balance during gait.    Status On-going   PT SHORT TERM GOAL #2   Title Patient ill state decreased frequency of vertigo symptoms to <1x daily and at and intensity <5/10   Status Achieved   PT SHORT TERM GOAL #3   Title Patient will dmeosntrate independence of HEP.    Status Achieved           PT Long Term Goals -  03/16/15 1218    PT LONG TERM GOAL #1   Title Patient will dmeonstrate a DGI score of >20 to indicate improving balance during gait and patient not at high risk of falls.    Status On-going   PT LONG TERM GOAL #2   Title Patient ill state decreased frequency of vertigo symptoms to <1x weekly and at and intensity <3/10   Status On-going   PT LONG TERM GOAL #3   Title Patient will state confidence in standing and sitting balance.    Status Partially Met   PT LONG TERM GOAL #4   Title Pt to be able to bend down and pick objects off the floor with confidence   Status On-going   PT LONG TERM GOAL #5   Title Patient to be able to go up and down curbs without falling with a cain   Status On-going           Plan - 03/30/15 1152    Clinical Impression Statement Treatment focused on increasing BLE strength with functional activities and  improving stability with gait. Pt required min A to correct LOB during walking with head turns, with verbal cueing given for gait speed. Pt had difficulty with forward step downs, requiring 2 HHA for balance during the activity. Treatment moving forward should include eccentric strengthening of BLE along with balance and gait training.    PT Next Visit Plan Reassess pt at next visit. Progress weighted dead lifts to 30 pounds. Continue with eccentric strengtheniing to increase BLE strength, continue challenging pt with gait training.         Problem List There are no active problems to display for this patient.   Hilma Favors, PT, DPT [id number] 03/30/2015, 11:56 AM  [place], [place], [address] Phone: [phone]   Fax:  [phone]

## 2015-04-01 ENCOUNTER — Ambulatory Visit (HOSPITAL_COMMUNITY): Payer: Medicare Other | Admitting: Physical Therapy

## 2015-04-01 DIAGNOSIS — R262 Difficulty in walking, not elsewhere classified: Secondary | ICD-10-CM

## 2015-04-01 DIAGNOSIS — Z9181 History of falling: Secondary | ICD-10-CM

## 2015-04-01 DIAGNOSIS — H8110 Benign paroxysmal vertigo, unspecified ear: Secondary | ICD-10-CM | POA: Diagnosis not present

## 2015-04-01 NOTE — Therapy (Signed)
[place], [place], [address] Phone: [phone]   Fax:  [phone]  Physical Therapy Treatment  Patient Details  Name: Thomas Oconnor MRN: [id number] Date of Birth: 10-13-1960 Referring Provider:  Elijio Miles, MD  Encounter Date: 04/01/2015      PT End of Session - 04/01/15 1145    Visit Number 13   Number of Visits 18   Date for PT Re-Evaluation 04/01/15   Authorization Type medicare   Authorization Time Period 12/19/14-03/21/15 Gcodes updated on 10th visit.    Authorization - Visit Number 13   Authorization - Number of Visits 20   PT Start Time 1100   PT Stop Time 1143   PT Time Calculation (min) 43 min   Equipment Utilized During Treatment Gait belt   Activity Tolerance Patient tolerated treatment well   Behavior During Therapy WFL for tasks assessed/performed      Past Medical History  Diagnosis Date  . Renal disorder   . Hypertension   . Diabetes mellitus without complication   . Blood transfusion without reported diagnosis     Past Surgical History  Procedure Laterality Date  . Nephrectomy transplanted organ    . Hernia repair    . Abdominal surgery    . Combined kidney-pancreas transplant Bilateral     There were no vitals filed for this visit.  Visit Diagnosis:  At high risk for falls  Difficulty walking      Subjective Assessment - 04/01/15 1148    Subjective Pt reports that he had some soreness in the back of his legs after last treatment, however, he feels fine today.    Currently in Pain? No/denies           [facility] Adult PT Treatment/Exercise - 04/01/15 0001    Knee/Hip Exercises: Stretches   Active Hamstring Stretch 2 reps;30 seconds   Hip Flexor Stretch Limitations 3x30 seconds seconds to 14" step   Knee/Hip Exercises: Standing   Heel Raises 20 reps   Heel Raises Limitations heel-toe raises   Forward Lunges Both;10 reps   Forward Lunges Limitations // bars  with BUE minimal support to maintain upright posture   Forward Step Up Both;2 sets;Hand Hold: 1;10 reps;Step Height: 6"   Step Down 1 set;10 reps;Step Height: 6"   Gait Training walking with horizontal and vertical head turns x 2 RT each   Other Standing Knee Exercises Sumo walking x 2 RT                  PT Short Term Goals - 03/16/15 1218    PT SHORT TERM GOAL #1   Title Patient will dmeonstrate a DGI score of >10 to indicate improvign balance during gait.    Status On-going   PT SHORT TERM GOAL #2   Title Patient ill state decreased frequency of vertigo symptoms to <1x daily and at and intensity <5/10   Status Achieved   PT SHORT TERM GOAL #3   Title Patient will dmeosntrate independence of HEP.    Status Achieved           PT Long Term Goals - 03/16/15 1218    PT LONG TERM GOAL #1   Title Patient will dmeonstrate a DGI score of >20 to indicate improving balance during gait and patient not at high risk of falls.    Status On-going   PT LONG TERM GOAL #2   Title Patient ill state decreased frequency of  vertigo symptoms to <1x weekly and at and intensity <3/10   Status On-going   PT LONG TERM GOAL #3   Title Patient will state confidence in standing and sitting balance.    Status Partially Met   PT LONG TERM GOAL #4   Title Pt to be able to bend down and pick objects off the floor with confidence   Status On-going   PT LONG TERM GOAL #5   Title Patient to be able to go up and down curbs without falling with a cain   Status On-going               Plan - 04/01/15 1146    Clinical Impression Statement Pt demonstrated improvements in step ups forward and laterally today, indicating that his BLE strength is improving. He continues to require min A to correct LOB during walking with head turns, and requires verbal cueing throughout strengthening exercises to maintian upright posture.    PT Next Visit Plan Reassess at next visit.         Problem  List There are no active problems to display for this patient.   Hilma Favors, PT, DPT [phone] 04/01/2015, 11:48 AM  [place], [place], [address] Phone: [phone]   Fax:  [phone]

## 2015-04-06 ENCOUNTER — Ambulatory Visit (HOSPITAL_COMMUNITY): Payer: Medicare Other | Admitting: Physical Therapy

## 2015-04-06 ENCOUNTER — Encounter (HOSPITAL_COMMUNITY): Payer: Medicare Other

## 2015-04-06 DIAGNOSIS — R262 Difficulty in walking, not elsewhere classified: Secondary | ICD-10-CM

## 2015-04-06 DIAGNOSIS — Z9181 History of falling: Secondary | ICD-10-CM

## 2015-04-06 DIAGNOSIS — H8110 Benign paroxysmal vertigo, unspecified ear: Secondary | ICD-10-CM | POA: Diagnosis not present

## 2015-04-06 NOTE — Therapy (Signed)
[facility] [address] [place], [place], [address] Phone: [phone]   Fax:  [phone]  Physical Therapy Treatment  Patient Details  Name: Thomas GirtLarry J Oconnor MRN: [id number] Date of Birth: January 03, 1961 Referring Provider:  Corie Chiquitoooney, Jeffrey W, MD  Encounter Date: 04/06/2015      PT End of Session - 04/06/15 1146    Visit Number 14   Number of Visits 18   Date for PT Re-Evaluation 04/01/15   Authorization Type medicare   Authorization Time Period 12/19/14-03/21/15 Gcodes updated on 10th visit.    Authorization - Visit Number 14   Authorization - Number of Visits 20   PT Start Time 1100   PT Stop Time 1142   PT Time Calculation (min) 42 min   Equipment Utilized During Treatment Gait belt   Activity Tolerance Patient tolerated treatment well      Past Medical History  Diagnosis Date  . Renal disorder   . Hypertension   . Diabetes mellitus without complication   . Blood transfusion without reported diagnosis     Past Surgical History  Procedure Laterality Date  . Nephrectomy transplanted organ    . Hernia repair    . Abdominal surgery    . Combined kidney-pancreas transplant Bilateral     There were no vitals filed for this visit.  Visit Diagnosis:  At high risk for falls  Difficulty walking      Subjective Assessment - 04/06/15 1104    Subjective Pt reports that he feels less stable today. He woke up this morning feeling fine, but after a couple of hours, he started to feel a little unsteady.   Currently in Pain? No/denies            [facility] - 04/06/15 0001    Assessment   Medical Diagnosis Vertigo   Onset Date/Surgical Date 08/22/14   Prior Therapy no   Strength   Right Hip Flexion 4/5   Right Hip Extension 3/5   Right Hip ABduction 2+/5   Left Hip Flexion 4/5   Left Hip Extension 3/5   Left Hip ABduction 2+/5   Right Knee Flexion 5/5   Right Knee Extension 4+/5   Left Knee Flexion 4+/5    Left Knee Extension 4+/5   Right Ankle Dorsiflexion 4/5   Left Ankle Dorsiflexion 4/5   Ambulation/Gait   Ambulation/Gait Yes   Ambulation Distance (Feet) 50 Feet   Assistive device Straight cane   Gait Pattern Decreased step length - right;Wide base of support;Lateral hip instability;Lateral trunk lean to left;Trendelenburg;Decreased trunk rotation   Ambulation Surface Level   Berg Balance Test   Sit to Stand Able to stand without using hands and stabilize independently   Standing Unsupported Able to stand safely 2 minutes   Sitting with Back Unsupported but Feet Supported on Floor or Stool Able to sit safely and securely 2 minutes   Stand to Sit Sits safely with minimal use of hands   Transfers Able to transfer safely, minor use of hands   Standing Unsupported with Eyes Closed Able to stand 10 seconds safely   Standing Ubsupported with Feet Together Able to place feet together independently and stand for 1 minute with supervision   From Standing, Reach Forward with Outstretched Arm Can reach confidently >25 cm (10")   From Standing Position, Pick up Object from Floor Able to pick up shoe, needs supervision   From Standing Position, Turn to Look Behind Over each Shoulder Looks  behind from both sides and weight shifts well   Turn 360 Degrees Needs close supervision or verbal cueing   Standing Unsupported, Alternately Place Feet on Step/Stool Able to complete 4 steps without aid or supervision   Standing Unsupported, One Foot in Front Able to take small step independently and hold 30 seconds   Standing on One Leg Tries to lift leg/unable to hold 3 seconds but remains standing independently   Total Score 44   Dynamic Gait Index   Level Surface Mild Impairment   Change in Gait Speed Moderate Impairment   Gait with Horizontal Head Turns Moderate Impairment   Gait with Vertical Head Turns Moderate Impairment   Gait and Pivot Turn Moderate Impairment   Step Over Obstacle Moderate Impairment    Step Around Obstacles Moderate Impairment   Steps Moderate Impairment   Total Score 9              [facility] Adult PT Treatment/Exercise - 04/06/15 0001    Knee/Hip Exercises: Stretches   Active Hamstring Stretch 3 reps;30 seconds   Hip Flexor Stretch Limitations 3x30 seconds seconds to 14" step   Knee/Hip Exercises: Standing   Forward Lunges Both;10 reps   Forward Lunges Limitations With trunk rotation   Side Lunges 1 set;10 reps   SLS tap up at box x 10 bilaterally with no UE support   Other Standing Knee Exercises stepping over 4 inch hurdles in // bars x 5 RT   Other Standing Knee Exercises Sumo walking x 3 RT in // bars   Knee/Hip Exercises: Seated   Other Seated Knee/Hip Exercises Alternating hip/shoulder flexion sitting on large swiss ball                PT Education - 04/06/15 1146    Education provided Yes   Education Details Educated on proper posture and Therapist, sports) Educated Patient   Methods Explanation   Comprehension Verbalized understanding          PT Short Term Goals - 04/06/15 1204    PT SHORT TERM GOAL #1   Title Patient will dmeonstrate a DGI score of >10 to indicate improvign balance during gait.    Time 4   Period Weeks   Status On-going   PT SHORT TERM GOAL #2   Title Patient ill state decreased frequency of vertigo symptoms to <1x daily and at and intensity <5/10   Time 4   Period Weeks   Status Achieved   PT SHORT TERM GOAL #3   Title Patient will dmeosntrate independence of HEP.    Time 4   Period Weeks   Status Achieved           PT Long Term Goals - 04/06/15 1204    PT LONG TERM GOAL #1   Title Patient will dmeonstrate a DGI score of >20 to indicate improving balance during gait and patient not at high risk of falls.    Time 8   Period Weeks   Status On-going   PT LONG TERM GOAL #2   Title Patient ill state decreased frequency of vertigo symptoms to <1x weekly and at and intensity <3/10   Time 8    Period Weeks   Status Achieved   PT LONG TERM GOAL #3   Title Patient will state confidence in standing and sitting balance.    Time 8   Period Weeks   PT LONG TERM GOAL #4   Title Pt to be able  to bend down and pick objects off the floor with confidence   Time 8   Period Weeks   Status On-going   PT LONG TERM GOAL #5   Title Patient to be able to go up and down curbs without falling with a cane   Time 4   Period Weeks   Status On-going               Plan - 04/06/15 1147    Clinical Impression Statement Pt continues to experience difficulty with reciprocal movements during gait, and also struggles with maintaining balance during gait and functional activities. He responded well to treatment today, and was challenged by stepping over hurdles in // bars and with opposite hip/shoulder flexion on swiss ball. His Sharlene Motts has slightly improved, however, he continues to demonstrate high risk for falls per the DGI, and demonstrates unsafe ambulation. He will benefit from contination of balance and gait training to decrease risk for falls, and it is recommended that he continue with his scheduled appointments with reassessment to be done on his final visit to determine if he is appropriate for D/C.    Pt will benefit from skilled therapeutic intervention in order to improve on the following deficits Abnormal gait;Decreased endurance;Improper body mechanics;Postural dysfunction;Pain;Impaired flexibility;Decreased strength;Decreased activity tolerance;Difficulty walking;Decreased balance;Impaired sensation   PT Frequency 2x / week   PT Duration 2 weeks   PT Treatment/Interventions Neuromuscular re-education;Stair training;ADLs/Self Care Home Management;Patient/family education;Therapeutic activities;Therapeutic exercise;Manual techniques;Balance training   PT Next Visit Plan Continue with gait and balance training with focus on reciprocal movements.        Problem List There are no active  problems to display for this patient.   Leona Singleton, PT, DPT [phone] 04/06/2015, 12:07 PM  [facility] [address] [place], [place], [address] Phone: [phone]   Fax:  [phone]

## 2015-04-08 ENCOUNTER — Ambulatory Visit (HOSPITAL_COMMUNITY): Payer: Medicare Other | Attending: Neurology | Admitting: Physical Therapy

## 2015-04-08 DIAGNOSIS — R296 Repeated falls: Secondary | ICD-10-CM | POA: Insufficient documentation

## 2015-04-08 DIAGNOSIS — H8110 Benign paroxysmal vertigo, unspecified ear: Secondary | ICD-10-CM | POA: Diagnosis present

## 2015-04-08 DIAGNOSIS — R262 Difficulty in walking, not elsewhere classified: Secondary | ICD-10-CM

## 2015-04-08 DIAGNOSIS — Z9181 History of falling: Secondary | ICD-10-CM

## 2015-04-08 NOTE — Therapy (Signed)
[facility] [address] [place], [place], [address] Phone: [phone]   Fax:  [phone]  Physical Therapy Treatment  Patient Details  Name: Thomas Oconnor MRN: [id number] Date of Birth: [birth year] Referring Provider:  Corie Chiquito, MD  Encounter Date: 04/08/2015      PT End of Session - 04/08/15 1215    Visit Number 15   Number of Visits 18   Date for PT Re-Evaluation 04/20/15   Authorization Type medicare   Authorization Time Period 12/19/14-03/21/15 Gcodes updated on 10th visit.    Authorization - Visit Number 15   Authorization - Number of Visits 20   PT Start Time 0940   PT Stop Time 1020   PT Time Calculation (min) 40 min   Activity Tolerance Patient tolerated treatment well      Past Medical History  Diagnosis Date  . Renal disorder   . Hypertension   . Diabetes mellitus without complication   . Blood transfusion without reported diagnosis     Past Surgical History  Procedure Laterality Date  . Nephrectomy transplanted organ    . Hernia repair    . Abdominal surgery    . Combined kidney-pancreas transplant Bilateral     There were no vitals filed for this visit.  Visit Diagnosis:  At high risk for falls  Difficulty walking  Benign paroxysmal positional vertigo, unspecified laterality      Subjective Assessment - 04/08/15 0939    Subjective Pt states he is doing well.   Pt states he is not able to do his exercises as much as he should .   Currently in Pain? No/denies                [facility] Adult PT Treatment/Exercise - 04/08/15 0001    Knee/Hip Exercises: Aerobic   Stationary Bike nustep hills 3 Level 5 x 12'    Knee/Hip Exercises: Standing   Forward Step Up Both;Step Height: 4"   Forward Step Up Limitations opposite hand with 3# dumbell into flexion    Other Standing Knee Exercises sidestep with green t-band x 2 RT; tandem stance x 1' B; cone rotation on foam   Knee/Hip Exercises:  Seated   Other Seated Knee/Hip Exercises sit to stand slowly x 10 rep B                  PT Short Term Goals - 04/06/15 1204    PT SHORT TERM GOAL #1   Title Patient will dmeonstrate a DGI score of >10 to indicate improvign balance during gait.    Time 4   Period Weeks   Status On-going   PT SHORT TERM GOAL #2   Title Patient ill state decreased frequency of vertigo symptoms to <1x daily and at and intensity <5/10   Time 4   Period Weeks   Status Achieved   PT SHORT TERM GOAL #3   Title Patient will dmeosntrate independence of HEP.    Time 4   Period Weeks   Status Achieved           PT Long Term Goals - 04/06/15 1204    PT LONG TERM GOAL #1   Title Patient will dmeonstrate a DGI score of >20 to indicate improving balance during gait and patient not at high risk of falls.    Time 8   Period Weeks   Status On-going   PT LONG TERM GOAL #2   Title Patient ill state  decreased frequency of vertigo symptoms to <1x weekly and at and intensity <3/10   Time 8   Period Weeks   Status Achieved   PT LONG TERM GOAL #3   Title Patient will state confidence in standing and sitting balance.    Time 8   Period Weeks   PT LONG TERM GOAL #4   Title Pt to be able to bend down and pick objects off the floor with confidence   Time 8   Period Weeks   Status On-going   PT LONG TERM GOAL #5   Title Patient to be able to go up and down curbs without falling with a cane   Time 4   Period Weeks   Status On-going               Plan - 04/08/15 1218    Clinical Impression Statement Pt completed all exercises with therapist facilitation for safety.  Pt demonstrates some improvement in balance but will  have some limitations secondary to limited eye sight.  Pt continues to improve in LE strength which is needed to decrease risk of falls.    PT Next Visit Plan Continue with gait and balance training with focus on reciprocal movements.        Problem List There are no  active problems to display for this patient.   Virgina OrganCynthia Dauntae Derusha, PT CLT [phone] 04/08/2015, 12:27 PM  [facility] [address] [place], [place], [address] Phone: [phone]   Fax:  [phone]

## 2015-04-13 ENCOUNTER — Ambulatory Visit (HOSPITAL_COMMUNITY): Payer: Medicare Other | Admitting: Physical Therapy

## 2015-04-13 DIAGNOSIS — H8110 Benign paroxysmal vertigo, unspecified ear: Secondary | ICD-10-CM

## 2015-04-13 DIAGNOSIS — R296 Repeated falls: Secondary | ICD-10-CM | POA: Diagnosis not present

## 2015-04-13 DIAGNOSIS — Z9181 History of falling: Secondary | ICD-10-CM

## 2015-04-13 DIAGNOSIS — R262 Difficulty in walking, not elsewhere classified: Secondary | ICD-10-CM

## 2015-04-13 NOTE — Therapy (Signed)
[facility] [address] [place], [place], [address] Phone: [phone]   Fax:  [phone]  Physical Therapy Treatment  Patient Details  Name: Thomas Oconnor MRN: [id number] Date of Birth: Aug 03, 1961 Referring Provider:  Corie Chiquitoooney, Jeffrey W, MD  Encounter Date: 04/13/2015      PT End of Session - 04/13/15 1138    Visit Number 16   Number of Visits 18   Date for PT Re-Evaluation 04/20/15   Authorization Type medicare   Authorization Time Period 12/19/14-03/21/15 Gcodes updated on 10th visit.    Authorization - Visit Number 16   Authorization - Number of Visits 20   PT Start Time 0940   PT Stop Time 1028   PT Time Calculation (min) 48 min   Equipment Utilized During Treatment Gait belt   Activity Tolerance Patient tolerated treatment well      Past Medical History  Diagnosis Date  . Renal disorder   . Hypertension   . Diabetes mellitus without complication   . Blood transfusion without reported diagnosis     Past Surgical History  Procedure Laterality Date  . Nephrectomy transplanted organ    . Hernia repair    . Abdominal surgery    . Combined kidney-pancreas transplant Bilateral     There were no vitals filed for this visit.  Visit Diagnosis:  At high risk for falls  Difficulty walking  Benign paroxysmal positional vertigo, unspecified laterality      Subjective Assessment - 04/13/15 0943    Subjective Pt states Saturday he had to take two trees off his property and strained his Lt gastroc area. He states his family actually took the trees down but he had to do a lot of walking over uneven ground.   He is sore in the hamstring area now.               [facility] Adult PT Treatment/Exercise - 04/13/15 0001    Knee/Hip Exercises: Stretches   Active Hamstring Stretch Both;3 reps;30 seconds   Gastroc Stretch Both;3 reps;30 seconds   Gastroc Stretch Limitations slant board    Knee/Hip Exercises: Standing   Forward  Step Up Both;Step Height: 4"   Forward Step Up Limitations opposite hand with 3# dumbell into flexion    SLS x 3 both with one finger hold    Other Standing Knee Exercises sidestep with green t-band x 2 RT; tandem stance x 1' B; cone rotation on foam   Knee/Hip Exercises: Seated   Other Seated Knee/Hip Exercises sit to stand slowly x 10 rep B              PT Short Term Goals - 04/06/15 1204    PT SHORT TERM GOAL #1   Title Patient will dmeonstrate a DGI score of >10 to indicate improvign balance during gait.    Time 4   Period Weeks   Status On-going   PT SHORT TERM GOAL #2   Title Patient ill state decreased frequency of vertigo symptoms to <1x daily and at and intensity <5/10   Time 4   Period Weeks   Status Achieved   PT SHORT TERM GOAL #3   Title Patient will dmeosntrate independence of HEP.    Time 4   Period Weeks   Status Achieved           PT Long Term Goals - 04/06/15 1204    PT LONG TERM GOAL #1   Title Patient will dmeonstrate a  DGI score of >20 to indicate improving balance during gait and patient not at high risk of falls.    Time 8   Period Weeks   Status On-going   PT LONG TERM GOAL #2   Title Patient ill state decreased frequency of vertigo symptoms to <1x weekly and at and intensity <3/10   Time 8   Period Weeks   Status Achieved   PT LONG TERM GOAL #3   Title Patient will state confidence in standing and sitting balance.    Time 8   Period Weeks   PT LONG TERM GOAL #4   Title Pt to be able to bend down and pick objects off the floor with confidence   Time 8   Period Weeks   Status On-going   PT LONG TERM GOAL #5   Title Patient to be able to go up and down curbs without falling with a cane   Time 4   Period Weeks   Status On-going            Plan - 04/13/15 1139    Clinical Impression Statement Pt continues to have balance and strenght deficits.  Pt will be reassessed next   treatment to assess improvement and determine if  skilled therapy is still warrented due to not being able to come in to therapy until 7/26 after next visit.    PT Next Visit Plan reassess to determine if skilled therapy is still needed.         Problem List There are no active problems to display for this patient.  Virgina Organ, PT CLT [phone] 04/13/2015, 11:43 AM  [facility] [address] [place], [place], [address] Phone: [phone]   Fax:  [phone]

## 2015-04-15 ENCOUNTER — Ambulatory Visit (HOSPITAL_COMMUNITY): Payer: Medicare Other | Admitting: Physical Therapy

## 2015-04-15 DIAGNOSIS — Z9181 History of falling: Secondary | ICD-10-CM

## 2015-04-15 DIAGNOSIS — R262 Difficulty in walking, not elsewhere classified: Secondary | ICD-10-CM

## 2015-04-15 DIAGNOSIS — H8110 Benign paroxysmal vertigo, unspecified ear: Secondary | ICD-10-CM

## 2015-04-15 DIAGNOSIS — R296 Repeated falls: Secondary | ICD-10-CM | POA: Diagnosis not present

## 2015-04-15 NOTE — Therapy (Signed)
[facility] [address] [place], [place], [address] Phone: [phone]   Fax:  [phone]  Physical Therapy Treatment (Re-assessment)  Patient Details  Name: Thomas Oconnor MRN: [id number] Date of Birth: 07/04/1961 Referring Provider:  Amanda Pea, MD  Encounter Date: 04/15/2015      PT End of Session - 04/15/15 1201    Visit Number 17   Number of Visits 19   Date for PT Re-Evaluation 05/13/15   Authorization Type medicare   Authorization Time Period G-codes updated 17th visit    Authorization - Visit Number 17   Authorization - Number of Visits 20   PT Start Time 1014   PT Stop Time 1108   PT Time Calculation (min) 54 min   Equipment Utilized During Treatment Gait belt   Activity Tolerance Patient tolerated treatment well   Behavior During Therapy WFL for tasks assessed/performed      Past Medical History  Diagnosis Date  . Renal disorder   . Hypertension   . Diabetes mellitus without complication   . Blood transfusion without reported diagnosis     Past Surgical History  Procedure Laterality Date  . Nephrectomy transplanted organ    . Hernia repair    . Abdominal surgery    . Combined kidney-pancreas transplant Bilateral     There were no vitals filed for this visit.  Visit Diagnosis:  At high risk for falls - Plan: PT plan of care cert/re-cert  Difficulty walking - Plan: PT plan of care cert/re-cert  Benign paroxysmal positional vertigo, unspecified laterality - Plan: PT plan of care cert/re-cert      Subjective Assessment - 04/15/15 1017    Subjective patient states that his dizziness and unsteadiness today; states that it has been hard for him to get appointments when he needs them especially since he does not drive due to poor vision    Pertinent History Patient began having vertigo 08/22/14 and patient was leanign heavy on cane , unsure why but when he sat up the "room took off and everything  sterted spinning patient cauge self to prevent self from falling. patient was throwing up due to severity of dizziness. Patient went to hospital. patient noted continued dry heaves. patient was given mecklazine and stopped throwing up. patient has continued to have some minor bouts of vertigo but with decreased severity. aptient notes that 9/10  severity in light headedness. 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 Patient had type 1 diabetes. patient has a history of diabetic neurapathy that has since improved.    Diagnostic tests 7/8- no imaging has been done    Patient Stated Goals patient want to be abl to decrease the frequency and intensity of vertigo/dizziness.    Currently in Pain? No/denies            [facility] PT Assessment - 04/15/15 0001    Assessment   Medical Diagnosis Vertigo   Onset Date/Surgical Date 08/22/14   Next MD Visit November with nephrologist    Prior Therapy no   Balance Screen   Has the patient fallen in the past 6 months No   Has the patient had a decrease in activity level because of a fear of falling?  No   Is the patient reluctant to leave their home because of a fear of falling?  No   Observation/Other Assessments   Focus on Therapeutic Outcomes (FOTO)  40% limited    Strength  Right Hip Flexion 4-/5   Right Hip Extension 3+/5   Right Hip ABduction 2+/5   Left Hip Flexion 4-/5   Left Hip Extension 3+/5   Left Hip ABduction 2+/5   Right Knee Flexion 4+/5   Right Knee Extension 4+/5   Left Knee Flexion 4+/5   Left Knee Extension 4+/5   Right Ankle Dorsiflexion 4/5   Left Ankle Dorsiflexion 4/5   Berg Balance Test   Sit to Stand Able to stand without using hands and stabilize independently   Standing Unsupported Able to stand safely 2 minutes   Sitting with Back Unsupported but Feet Supported on Floor or Stool Able to sit safely and securely 2 minutes   Stand to Sit Sits safely with minimal use of hands    Transfers Able to transfer safely, minor use of hands   Standing Unsupported with Eyes Closed Able to stand 10 seconds safely   Standing Ubsupported with Feet Together Able to place feet together independently and stand for 1 minute with supervision   From Standing, Reach Forward with Outstretched Arm Can reach confidently >25 cm (10")   From Standing Position, Pick up Object from Floor Able to pick up shoe, needs supervision   From Standing Position, Turn to Look Behind Over each Shoulder Looks behind from both sides and weight shifts well   Turn 360 Degrees Able to turn 360 degrees safely but slowly   Standing Unsupported, Alternately Place Feet on Step/Stool Able to complete >2 steps/needs minimal assist   Standing Unsupported, One Foot in Front Able to take small step independently and hold 30 seconds   Standing on One Leg Able to lift leg independently and hold equal to or more than 3 seconds   Total Score 45   Dynamic Gait Index   Level Surface Mild Impairment   Change in Gait Speed Moderate Impairment   Gait with Horizontal Head Turns Moderate Impairment   Gait with Vertical Head Turns Moderate Impairment   Gait and Pivot Turn Moderate Impairment   Step Over Obstacle Moderate Impairment   Step Around Obstacles Mild Impairment   Steps Moderate Impairment   Total Score 10                             PT Education - 04/15/15 1201    Education provided Yes   Education Details education on plan of care moving forward, final two sessions to fine tune HEP before DC to home management    Person(s) Educated Patient   Methods Explanation   Comprehension Verbalized understanding          PT Short Term Goals - 04/15/15 1100    PT SHORT TERM GOAL #1   Title Patient will dmeonstrate a DGI score of >10 to indicate improvign balance during gait.    Time 4   Period Weeks   Status On-going   PT SHORT TERM GOAL #2   Title Patient ill state decreased frequency of  vertigo symptoms to <1x daily and at and intensity <5/10   Baseline Patient states that it fluctuates    Time 4   Period Weeks   Status Achieved   PT SHORT TERM GOAL #3   Title Patient will dmeosntrate independence of HEP.    Time 4   Period Weeks   Status Achieved           PT Long Term Goals - 04/15/15 1102    PT  LONG TERM GOAL #1   Title Patient will dmeonstrate a DGI score of >20 to indicate improving balance during gait and patient not at high risk of falls.    Time 8   Period Weeks   Status On-going   PT LONG TERM GOAL #2   Title Patient ill state decreased frequency of vertigo symptoms to <1x weekly and at and intensity <3/10   Time 8   Period Weeks   Status Achieved   PT LONG TERM GOAL #3   Title Patient will state confidence in standing and sitting balance.    Time 8   Period Weeks   Status Partially Met   PT LONG TERM GOAL #4   Title Pt to be able to bend down and pick objects off the floor with confidence   Time 8   Period Weeks   Status On-going   PT LONG TERM GOAL #5   Title Patient to be able to go up and down curbs without falling with a cane   Time 4   Period Weeks   Status Achieved               Plan - 04-21-2015 1202    Clinical Impression Statement Re-assessment performed today. Patient shows generally the same level of strength and scored virtually the same as he did at time of previous re-assessment, indicating that progress with skilled PT services has slowed. Patient very open and honest, stating that some days are good and he has very few limitations and other days he is much more limited, does not appear to be related to any specific factor. At this time recommend 2 follow up sessions to fine tune HEP  before discharge to home management.    Pt will benefit from skilled therapeutic intervention in order to improve on the following deficits Abnormal gait;Decreased endurance;Improper body mechanics;Postural dysfunction;Pain;Impaired  flexibility;Decreased strength;Decreased activity tolerance;Difficulty walking;Decreased balance;Impaired sensation   Rehab Potential Fair   PT Frequency Other (comment)  2 more sessions    PT Duration Other (comment)  2 more sessions    PT Treatment/Interventions Neuromuscular re-education;Stair training;ADLs/Self Care Home Management;Patient/family education;Therapeutic activities;Therapeutic exercise;Manual techniques;Balance training   PT Next Visit Plan focus on fine-tuning HEP    Consulted and Agree with Plan of Care Patient          G-Codes - 2015-04-21 1205    Functional Assessment Tool Used FOTO 40% limited    Functional Limitation Mobility: Walking and moving around   Mobility: Walking and Moving Around Current Status (T2671) At least 40 percent but less than 60 percent impaired, limited or restricted   Mobility: Walking and Moving Around Goal Status (I4580) At least 20 percent but less than 40 percent impaired, limited or restricted      Problem List There are no active problems to display for this patient.  Physical Therapy Progress Note  Dates of Reporting Period: 04/06/15 to 04-21-2015  Objective Reports of Subjective Statement: Patient reports that he has both good days and bad days, really fluctuates recently   Objective Measurements: see above   Goal Update: see above   Plan: see above   Reason Skilled Services are Required: fine-tuning HEP for safe performance at home, patient education     Deniece Ree PT, DPT [place], [place], [address] Phone: [phone]   Fax:  [phone]

## 2015-05-03 ENCOUNTER — Encounter (HOSPITAL_COMMUNITY): Payer: Medicare Other | Admitting: Physical Therapy

## 2015-05-05 ENCOUNTER — Encounter (HOSPITAL_COMMUNITY): Payer: Medicare Other | Admitting: Physical Therapy

## 2015-05-11 ENCOUNTER — Encounter (HOSPITAL_COMMUNITY): Payer: Medicare Other | Admitting: Physical Therapy

## 2015-05-13 ENCOUNTER — Encounter (HOSPITAL_COMMUNITY): Payer: Medicare Other | Admitting: Physical Therapy

## 2015-08-31 ENCOUNTER — Encounter (HOSPITAL_COMMUNITY): Payer: Self-pay | Admitting: Physical Therapy

## 2015-08-31 NOTE — Therapy (Signed)
[facility] [place], [place], [address] Phone: [phone]   Fax:  [phone]  Patient Details  Name: Thomas Oconnor MRN: [id number] Date of Birth: 08/24/1961 Referring Provider:  No ref. provider found  Encounter Date: 08/31/2015   PHYSICAL THERAPY DISCHARGE SUMMARY  Visits from Start of Care:  Current functional level related to goals / functional outcomes:  Pt did not return to PT following last treatment. Attached is most recent goal status.   PT Short Term Goals - 04/15/15 1100    PT SHORT TERM GOAL #1   Title Patient will dmeonstrate a DGI score of >10 to indicate improvign balance during gait.    Time 4   Period Weeks   Status On-going   PT SHORT TERM GOAL #2   Title Patient ill state decreased frequency of vertigo symptoms to <1x daily and at and intensity <5/10   Baseline Patient states that it fluctuates    Time 4   Period Weeks   Status Achieved   PT SHORT TERM GOAL #3   Title Patient will dmeosntrate independence of HEP.    Time 4   Period Weeks   Status Achieved           PT Long Term Goals - 04/15/15 1102    PT LONG TERM GOAL #1   Title Patient will dmeonstrate a DGI score of >20 to indicate improving balance during gait and patient not at high risk of falls.    Time 8   Period Weeks   Status On-going   PT LONG TERM GOAL #2   Title Patient ill state decreased frequency of vertigo symptoms to <1x weekly and at and intensity <3/10   Time 8   Period Weeks   Status Achieved   PT LONG TERM GOAL #3   Title Patient will state confidence in standing and sitting balance.    Time 8   Period Weeks   Status Partially Met   PT LONG TERM GOAL #4   Title Pt to be able to bend down and pick objects off the floor with confidence   Time 8   Period Weeks   Status On-going   PT LONG TERM GOAL  #5   Title Patient to be able to go up and down curbs without falling with a cane   Time 4   Period Weeks   Status Achieved            Remaining deficits: See above   Education / Equipment: N/A  Plan:                                                    Patient goals were partially met. Patient is being discharged due to not returning since the last visit.  ?????      Hilma Favors, PT, DPT [id number] 08/31/2015, 2:43 PM  [facility] [address] [place], [place], [address] Phone: [phone]   Fax:  [phone]

## 2018-11-27 ENCOUNTER — Encounter (HOSPITAL_COMMUNITY): Payer: Self-pay | Admitting: Emergency Medicine

## 2018-11-27 ENCOUNTER — Emergency Department (HOSPITAL_COMMUNITY): Payer: Medicare Other

## 2018-11-27 ENCOUNTER — Emergency Department (HOSPITAL_COMMUNITY)
Admission: EM | Admit: 2018-11-27 | Discharge: 2018-11-27 | Disposition: A | Discharge status: Home / Self Care | Payer: Medicare Other | Attending: Emergency Medicine | Admitting: Emergency Medicine

## 2018-11-27 ENCOUNTER — Other Ambulatory Visit: Payer: Self-pay

## 2018-11-27 DIAGNOSIS — Z79899 Other long term (current) drug therapy: Secondary | ICD-10-CM | POA: Diagnosis not present

## 2018-11-27 DIAGNOSIS — I10 Essential (primary) hypertension: Secondary | ICD-10-CM | POA: Diagnosis not present

## 2018-11-27 DIAGNOSIS — J101 Influenza due to other identified influenza virus with other respiratory manifestations: Secondary | ICD-10-CM | POA: Diagnosis not present

## 2018-11-27 DIAGNOSIS — E119 Type 2 diabetes mellitus without complications: Secondary | ICD-10-CM | POA: Diagnosis not present

## 2018-11-27 DIAGNOSIS — I82492 Acute embolism and thrombosis of other specified deep vein of left lower extremity: Secondary | ICD-10-CM | POA: Insufficient documentation

## 2018-11-27 DIAGNOSIS — J111 Influenza due to unidentified influenza virus with other respiratory manifestations: Secondary | ICD-10-CM

## 2018-11-27 DIAGNOSIS — R05 Cough: Secondary | ICD-10-CM | POA: Diagnosis present

## 2018-11-27 DIAGNOSIS — I82432 Acute embolism and thrombosis of left popliteal vein: Secondary | ICD-10-CM

## 2018-11-27 LAB — COMPREHENSIVE METABOLIC PANEL WITH GFR
ALT: 27 U/L (ref 0–44)
AST: 30 U/L (ref 15–41)
Albumin: 3.5 g/dL (ref 3.5–5.0)
Alkaline Phosphatase: 72 U/L (ref 38–126)
Anion gap: 6 (ref 5–15)
BUN: 26 mg/dL — ABNORMAL HIGH (ref 6–20)
CO2: 21 mmol/L — ABNORMAL LOW (ref 22–32)
Calcium: 8.9 mg/dL (ref 8.9–10.3)
Chloride: 111 mmol/L (ref 98–111)
Creatinine, Ser: 1.51 mg/dL — ABNORMAL HIGH (ref 0.61–1.24)
GFR calc Af Amer: 58 mL/min — ABNORMAL LOW
GFR calc non Af Amer: 50 mL/min — ABNORMAL LOW
Glucose, Bld: 118 mg/dL — ABNORMAL HIGH (ref 70–99)
Potassium: 5.1 mmol/L (ref 3.5–5.1)
Sodium: 138 mmol/L (ref 135–145)
Total Bilirubin: 0.5 mg/dL (ref 0.3–1.2)
Total Protein: 6.6 g/dL (ref 6.5–8.1)

## 2018-11-27 LAB — CBC WITH DIFFERENTIAL/PLATELET
Abs Immature Granulocytes: 0.02 10*3/uL (ref 0.00–0.07)
Basophils Absolute: 0 10*3/uL (ref 0.0–0.1)
Basophils Relative: 0 %
EOS ABS: 0.2 10*3/uL (ref 0.0–0.5)
Eosinophils Relative: 4 %
HCT: 37 % — ABNORMAL LOW (ref 39.0–52.0)
HEMOGLOBIN: 11.9 g/dL — AB (ref 13.0–17.0)
IMMATURE GRANULOCYTES: 0 %
LYMPHS ABS: 0.7 10*3/uL (ref 0.7–4.0)
LYMPHS PCT: 16 %
MCH: 31.1 pg (ref 26.0–34.0)
MCHC: 32.2 g/dL (ref 30.0–36.0)
MCV: 96.6 fL (ref 80.0–100.0)
MONOS PCT: 13 %
Monocytes Absolute: 0.6 10*3/uL (ref 0.1–1.0)
NEUTROS ABS: 3 10*3/uL (ref 1.7–7.7)
NRBC: 0 % (ref 0.0–0.2)
Neutrophils Relative %: 67 %
Platelets: 161 10*3/uL (ref 150–400)
RBC: 3.83 MIL/uL — ABNORMAL LOW (ref 4.22–5.81)
RDW: 12.7 % (ref 11.5–15.5)
WBC: 4.5 10*3/uL (ref 4.0–10.5)

## 2018-11-27 LAB — INFLUENZA PANEL BY PCR (TYPE A & B)
Influenza A By PCR: POSITIVE — AB
Influenza B By PCR: NEGATIVE

## 2018-11-27 MED ORDER — APIXABAN 5 MG PO TABS
10.0000 mg | ORAL_TABLET | Freq: Once | ORAL | Status: AC
  Administered 2018-11-27: 10 mg via ORAL
  Filled 2018-11-27: qty 2

## 2018-11-27 MED ORDER — SODIUM CHLORIDE 0.9 % IV BOLUS
1000.0000 mL | Freq: Once | INTRAVENOUS | Status: AC
  Administered 2018-11-27: 1000 mL via INTRAVENOUS

## 2018-11-27 MED ORDER — OSELTAMIVIR PHOSPHATE 75 MG PO CAPS: 75.0000 mg | ORAL_CAPSULE | Freq: Two times a day (BID) | ORAL | 0 refills | Status: AC

## 2018-11-27 MED ORDER — BENZONATATE 100 MG PO CAPS: 200.0000 mg | ORAL_CAPSULE | Freq: Three times a day (TID) | ORAL | 0 refills | Status: AC | PRN

## 2018-11-27 MED ORDER — ELIQUIS 5 MG VTE STARTER PACK: ORAL_TABLET | ORAL | 0 refills | Status: AC

## 2018-11-27 MED ORDER — IOPAMIDOL (ISOVUE-370) INJECTION 76%: 100.0000 mL | Freq: Once | INTRAVENOUS | Status: DC | PRN

## 2018-11-27 MED ORDER — OSELTAMIVIR PHOSPHATE 75 MG PO CAPS
75.0000 mg | ORAL_CAPSULE | Freq: Once | ORAL | Status: AC
  Administered 2018-11-27: 75 mg via ORAL
  Filled 2018-11-27: qty 1

## 2018-11-27 NOTE — ED Provider Notes (Signed)
Patient unfortunately not eligible for CT angios to rule out pulmonary embolus.  Due to having the one kidney he was told not to get heavy dye loads.  Patient's vital signs not tachycardic not hypoxic has not had any significant change respiratory wise other than what seems to be consistent with the positive influenza testing.  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  I think if patient has a pulmonary embolism it would be small.  Patient does not want to wait for nuclear medicine VQ scan.  But also with his upper respiratory infection he is coughing frequently would probably be difficult to get a good accurate reading on that.  I think he is clinically okay to be treated as an outpatient for the DVT also needs to be treated with Tamiflu typically since he is immunosuppressed.  His creatinine clearance is above 60 so is okay for full dose.  And something for cough suppression.  He is got a call [facility] transplant service in the morning to update him with these 2 new diagnosis is.  Patient currently nontoxic no acute distress based on vital signs patient will return if anything gets worse at all he understands that he is at risk.   Vanetta Mulders, MD 11/27/18 [phone]

## 2018-11-27 NOTE — ED Triage Notes (Signed)
Patient reports cough, congestion x 1 month, v/d x 3 days, and L calf swelling x 10-12 days ago.

## 2018-11-27 NOTE — Discharge Instructions (Addendum)
Called [facility] transplant tomorrow to let them know about the deep vein thrombosis in about the flu test being positive.  Take the Tamiflu as directed.  Your kidney function allows full dose.  Take Tessalon Perles as needed for cough.  And take the Eliquis for the deep vein thrombosis.  Return for any shortness of breath any chest pain any rapid heart rate.

## 2018-11-27 NOTE — ED Provider Notes (Signed)
[facility] Provider Note   CSN: [id number] Arrival date & time: 11/27/18  1207    History   Chief Complaint Chief Complaint  Patient presents with  . Influenza    HPI Thomas Oconnor is a 58 y.o. male.     Patient complains of cough for a month that is gotten worse over the last few days and also complains of swelling to the left leg  The history is provided by the patient. No language interpreter was used.  Influenza  Presenting symptoms: cough   Presenting symptoms: no diarrhea, no fatigue and no headaches   Severity:  Moderate Onset quality:  Gradual Duration:  4 weeks Progression:  Worsening Chronicity:  New Relieved by:  Nothing Worsened by:  Nothing Ineffective treatments:  None tried Associated symptoms: no chills and no congestion   Risk factors: kidney disease     Past Medical History:  Diagnosis Date  . Blood transfusion without reported diagnosis   . Diabetes mellitus without complication (HCC)   . Hypertension   . Renal disorder     There are no active problems to display for this patient.   Past Surgical History:  Procedure Laterality Date  . ABDOMINAL SURGERY    . COMBINED KIDNEY-PANCREAS TRANSPLANT Bilateral   . HERNIA REPAIR    . NEPHRECTOMY TRANSPLANTED ORGAN          Home Medications    Prior to Admission medications   Medication Sig Start Date End Date Taking? Authorizing Provider  carvedilol (COREG) 12.5 MG tablet Take 1 tablet by mouth 2 (two) times daily.  05/20/14  Yes [provider]  cycloSPORINE modified (NEORAL) 100 MG capsule Take 1 capsule by mouth 2 (two) times daily. 05/20/14  Yes [provider]  cycloSPORINE modified (NEORAL) 25 MG capsule Take 2 capsules by mouth 2 (two) times daily. 05/20/14  Yes [provider]  losartan (COZAAR) 25 MG tablet Take 1 tablet by mouth daily. 05/20/14  Yes [provider]  Multiple Vitamin (MULTIVITAMIN WITH MINERALS) TABS tablet  Take 1 tablet by mouth daily.   Yes [provider]  vitamin B-12 (CYANOCOBALAMIN) 1000 MCG tablet Take 1,000 mcg by mouth daily.   Yes [provider]  baclofen (LIORESAL) 10 MG tablet Take 5-10 mg by mouth daily.  07/15/14   [provider]  benzonatate (TESSALON) 100 MG capsule Take 2 capsules (200 mg total) by mouth 3 (three) times daily as needed for cough. 11/27/18   Bethann BerkshireZammit, Arvis Miguez, MD  diazepam (VALIUM) 5 MG tablet Take 1 tablet (5 mg total) by mouth 2 (two) times daily. Patient not taking: Reported on 11/27/2018 08/23/14   Geoffery Lyonselo, Douglas, MD  Everlene BallsELIQUIS DVT/PE STARTER PACK (ELIQUIS STARTER PACK) 5 MG TABS Take as directed on package: start with two-5mg  tablets twice daily for 7 days. On day 8, switch to one-5mg  tablet twice daily. 11/27/18   Bethann BerkshireZammit, Kale Dols, MD  meclizine (ANTIVERT) 25 MG tablet Take 1 tablet (25 mg total) by mouth 3 (three) times daily as needed for dizziness. 08/23/14   Geoffery Lyonselo, Douglas, MD  oseltamivir (TAMIFLU) 75 MG capsule Take 1 capsule (75 mg total) by mouth every 12 (twelve) hours. 11/27/18   Bethann BerkshireZammit, Latecia Miler, MD    Family History Family History  Problem Relation Age of Onset  . Cancer Mother   . Diabetes Father   . Cancer Other     Social History Social History   Tobacco Use  . Smoking status: Never Smoker  .  Smokeless tobacco: Never Used  Substance Use Topics  . Alcohol use: No  . Drug use: No     Allergies   Morphine and related   Review of Systems Review of Systems  Constitutional: Negative for appetite change, chills and fatigue.  HENT: Negative for congestion, ear discharge and sinus pressure.   Eyes: Negative for discharge.  Respiratory: Positive for cough.   Cardiovascular: Negative for chest pain.  Gastrointestinal: Negative for abdominal pain and diarrhea.  Genitourinary: Negative for frequency and hematuria.  Musculoskeletal: Negative for back pain.       Swelling to left leg  Skin: Negative for rash.    Neurological: Negative for seizures and headaches.  Psychiatric/Behavioral: Negative for hallucinations.     Physical Exam Updated Vital Signs BP (!) 175/85   Pulse 76   Temp 98.2 F (36.8 C) (Oral)   Resp 17   Ht 5\' 8"  (1.727 m)   Wt 90.7 kg   SpO2 96%   BMI 30.41 kg/m   Physical Exam Vitals signs and nursing note reviewed.  Constitutional:      Appearance: He is well-developed.  HENT:     Head: Normocephalic.     Nose: Nose normal.  Eyes:     General: No scleral icterus.    Conjunctiva/sclera: Conjunctivae normal.  Neck:     Musculoskeletal: Neck supple.     Thyroid: No thyromegaly.  Cardiovascular:     Rate and Rhythm: Normal rate and regular rhythm.     Heart sounds: No murmur. No friction rub. No gallop.   Pulmonary:     Breath sounds: No stridor. No wheezing or rales.  Chest:     Chest wall: No tenderness.  Abdominal:     General: There is no distension.     Tenderness: There is no abdominal tenderness. There is no rebound.  Musculoskeletal: Normal range of motion.     Comments: Tender swollen left leg  Lymphadenopathy:     Cervical: No cervical adenopathy.  Skin:    Findings: No erythema or rash.  Neurological:     Mental Status: He is oriented to person, place, and time.     Motor: No abnormal muscle tone.     Coordination: Coordination normal.  Psychiatric:        Behavior: Behavior normal.      ED Treatments / Results  Labs (all labs ordered are listed, but only abnormal results are displayed) Labs Reviewed  CBC WITH DIFFERENTIAL/PLATELET - Abnormal; Notable for the following components:      Result Value   RBC 3.83 (*)    Hemoglobin 11.9 (*)    HCT 37.0 (*)    All other components within normal limits  COMPREHENSIVE METABOLIC PANEL - Abnormal; Notable for the following components:   CO2 21 (*)    Glucose, Bld 118 (*)    BUN 26 (*)    Creatinine, Ser 1.51 (*)    GFR calc non Af Amer 50 (*)    GFR calc Af Amer 58 (*)    All other  components within normal limits  INFLUENZA PANEL BY PCR (TYPE A & B) - Abnormal; Notable for the following components:   Influenza A By PCR POSITIVE (*)    All other components within normal limits    EKG None  Radiology Dg Chest 2 View  Result Date: 11/27/2018 CLINICAL DATA:  Nonproductive cough for 1 month.  Chest pain. EXAM: CHEST - 2 VIEW COMPARISON:  None. FINDINGS: The cardiac silhouette  is upper limits of normal in size. No airspace consolidation, edema, pleural effusion, or pneumothorax is identified. Multiple old right rib fractures are noted. IMPRESSION: No active cardiopulmonary disease. Electronically Signed   By: Sebastian AcheAllen  Grady M.D.   On: 11/27/2018 13:08   [place] Venous Img Lower Unilateral Left  Result Date: 11/27/2018 CLINICAL DATA:  58 year old male with a history of left leg swelling EXAM: BILATERAL LOWER EXTREMITY VENOUS DOPPLER ULTRASOUND TECHNIQUE: Gray-scale sonography with graded compression, as well as color Doppler and duplex ultrasound were performed to evaluate the lower extremity deep venous systems from the level of the common femoral vein and including the common femoral, femoral, profunda femoral, popliteal and calf veins including the posterior tibial, peroneal and gastrocnemius veins when visible. The superficial great saphenous vein was also interrogated. Spectral Doppler was utilized to evaluate flow at rest and with distal augmentation maneuvers in the common femoral, femoral and popliteal veins. COMPARISON:  None. FINDINGS: RIGHT LOWER EXTREMITY Common Femoral Vein: No evidence of thrombus. Normal compressibility, respiratory phasicity and response to augmentation. Saphenofemoral Junction: No evidence of thrombus. Normal compressibility and flow on color Doppler imaging. Profunda Femoral Vein: No evidence of thrombus. Normal compressibility and flow on color Doppler imaging. Femoral Vein: No evidence of thrombus. Normal compressibility, respiratory phasicity and  response to augmentation. Popliteal Vein: No evidence of thrombus. Normal compressibility, respiratory phasicity and response to augmentation. Calf Veins: No evidence of thrombus. Normal compressibility and flow on color Doppler imaging. Superficial Great Saphenous Vein: No evidence of thrombus. Normal compressibility and flow on color Doppler imaging. Other Findings:  None. LEFT LOWER EXTREMITY Common Femoral Vein: No evidence of thrombus. Normal compressibility, respiratory phasicity and response to augmentation. Saphenofemoral Junction: No evidence of thrombus. Normal compressibility and flow on color Doppler imaging. Profunda Femoral Vein: No evidence of thrombus. Normal compressibility and flow on color Doppler imaging. Femoral Vein: Occlusive thrombus through the length of the femoral vein into the popliteal vein. Popliteal Vein: Occlusive thrombus of the popliteal vein. Calf Veins: No evidence of thrombus. Normal compressibility and flow on color Doppler imaging. Superficial Great Saphenous Vein: No evidence of thrombus. Normal compressibility and flow on color Doppler imaging. Other Findings:  None. IMPRESSION: Sonographic survey of the left lower extremity positive for DVT involving femoral vein, popliteal vein. Sonographic survey of the right lower extremity negative for DVT. Electronically Signed   By: Gilmer MorJaime  Wagner D.O.   On: 11/27/2018 15:03    Procedures Procedures (including critical care time)  Medications Ordered in ED Medications  apixaban (ELIQUIS) tablet 10 mg (has no administration in time range)  sodium chloride 0.9 % bolus 1,000 mL (1,000 mLs Intravenous New Bag/Given 11/27/18 1337)     Initial Impression / Assessment and Plan / ED Course  I have reviewed the triage vital signs and the nursing notes.  Pertinent labs & imaging results that were available during my care of the patient were reviewed by me and considered in my medical decision making (see chart for details).         Patient with influenza and DVT left leg.  He will get a CT scan of his chest to rule out a PE.  If patient does not have a PE he will be sent home with Eliquis Tamiflu and Tessalon Perles  Final Clinical Impressions(s) / ED Diagnoses   Final diagnoses:  None    ED Discharge Orders         Ordered    ELIQUIS DVT/PE STARTER PACK (ELIQUIS STARTER PACK)  5 MG TABS     11/27/18 1546    oseltamivir (TAMIFLU) 75 MG capsule  Every 12 hours     11/27/18 1546    benzonatate (TESSALON) 100 MG capsule  3 times daily PRN     11/27/18 1546           Bethann Berkshire, MD 11/27/18 1553

## 2018-11-27 NOTE — ED Notes (Signed)
Patient given discharge instruction, verbalized understand. IV removed, band aid applied. Patient ambulatory out of the department.  

## 2019-03-03 IMAGING — US VENOUS DOPPLER ULTRASOUND OF LEFT LOWER EXTREMITY
1 series · 13 of 24 positions shown · non-contrast
Comparison: None.

CLINICAL DATA: 57-year-old male with a history of left leg swelling



[Series 1: venous doppler ultrasound of left lower extremity · 0.11mm/px · 13 of 111 slices shown]
[im 1/111]
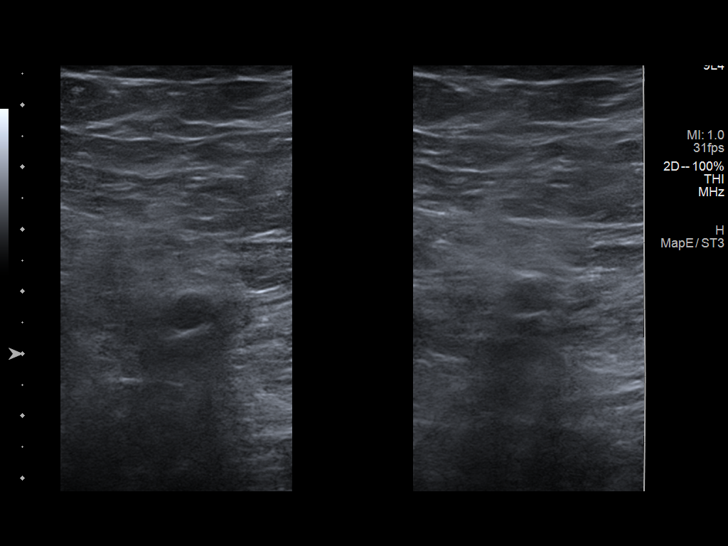
[im 10/111]
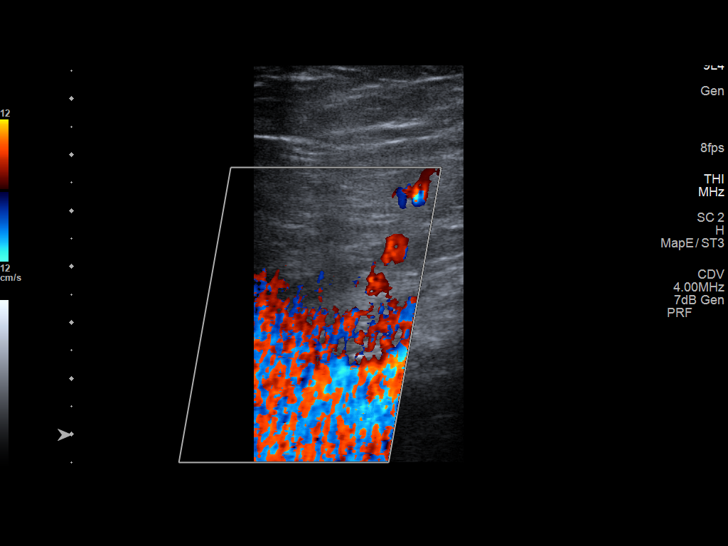
[im 20/111]
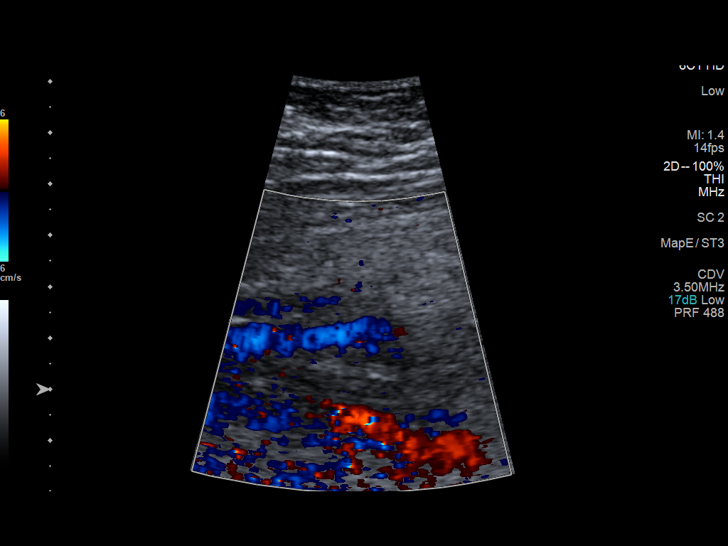
[im 29/111]
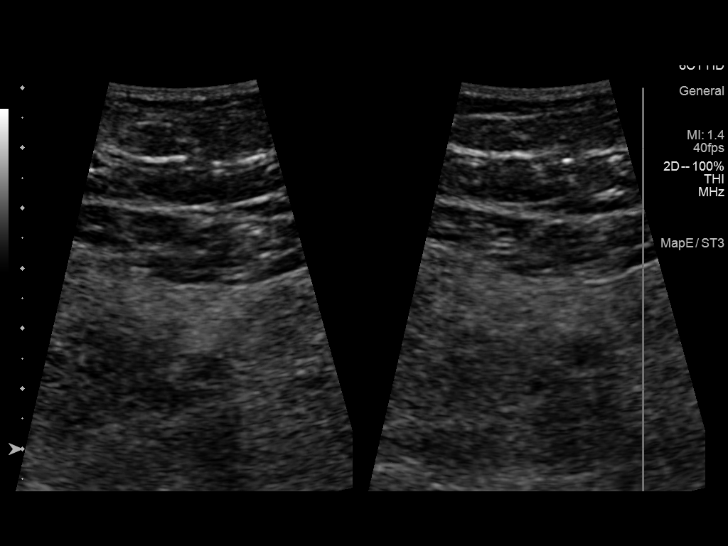
[im 39/111]
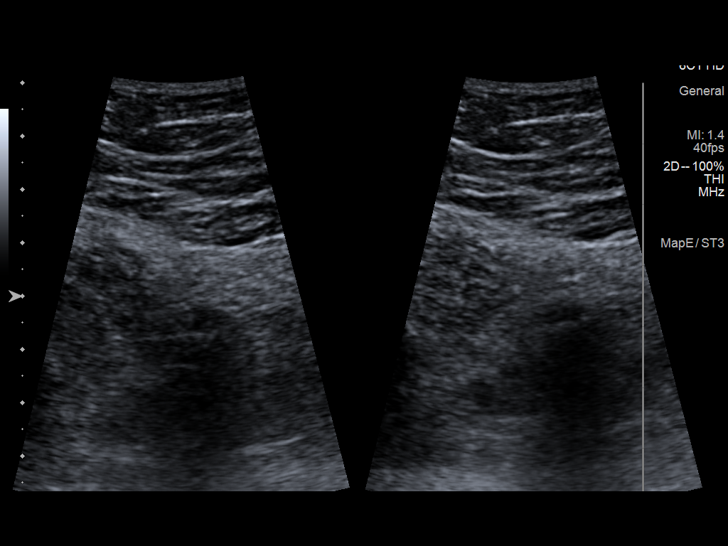
[im 48/111]
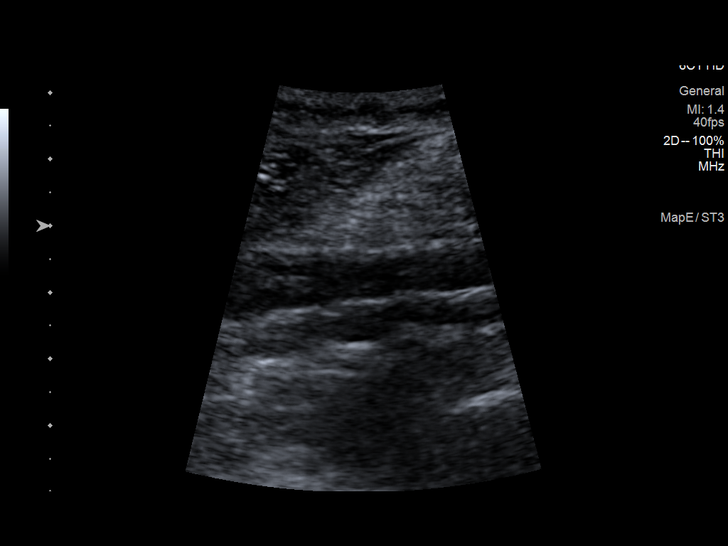
[im 58/111]
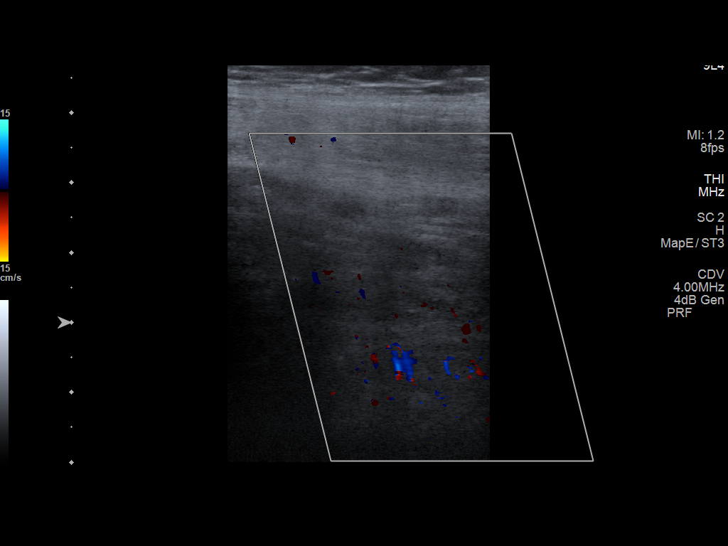
[im 63/111]
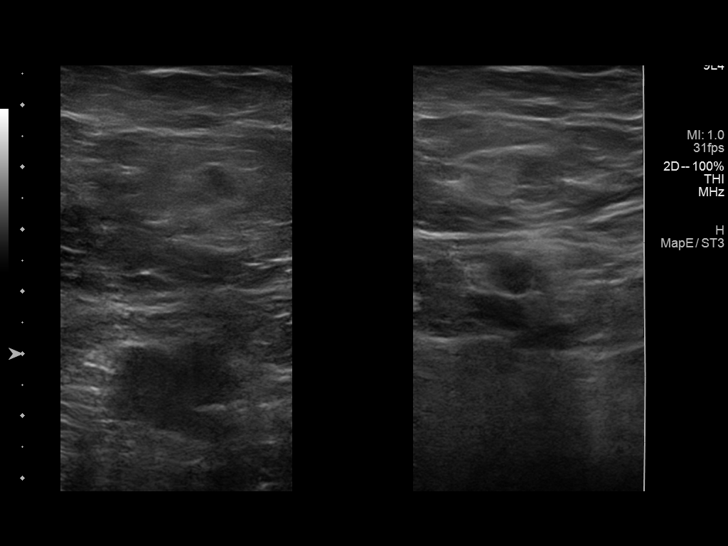
[im 72/111]
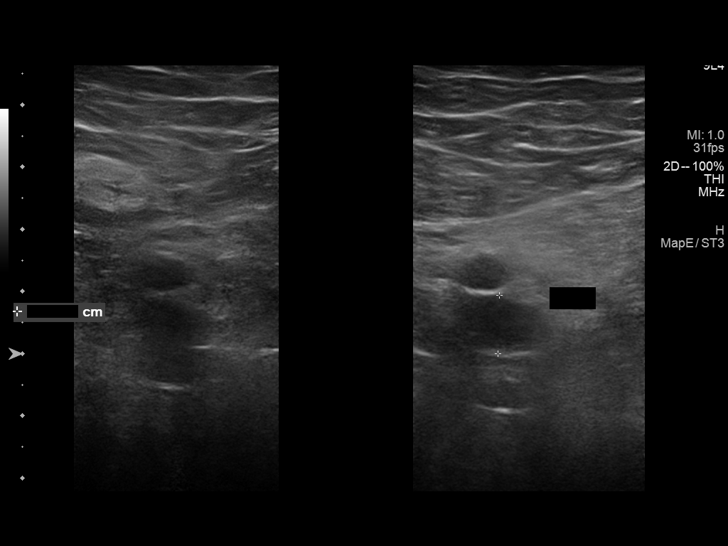
[im 82/111]
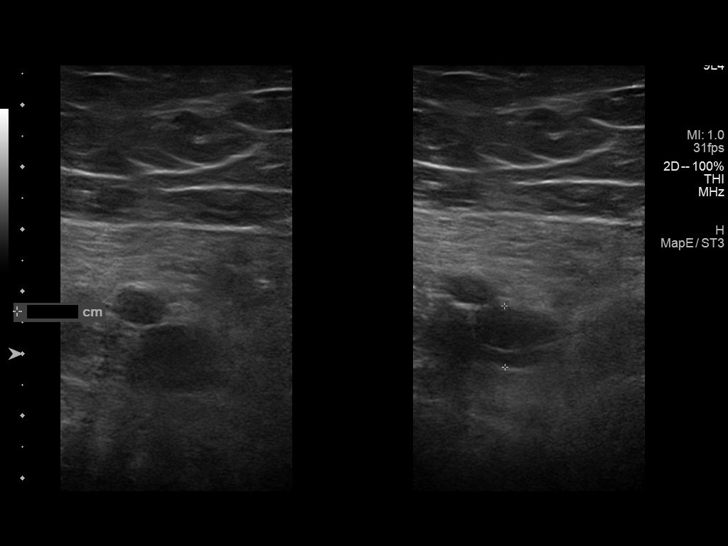
[im 91/111]
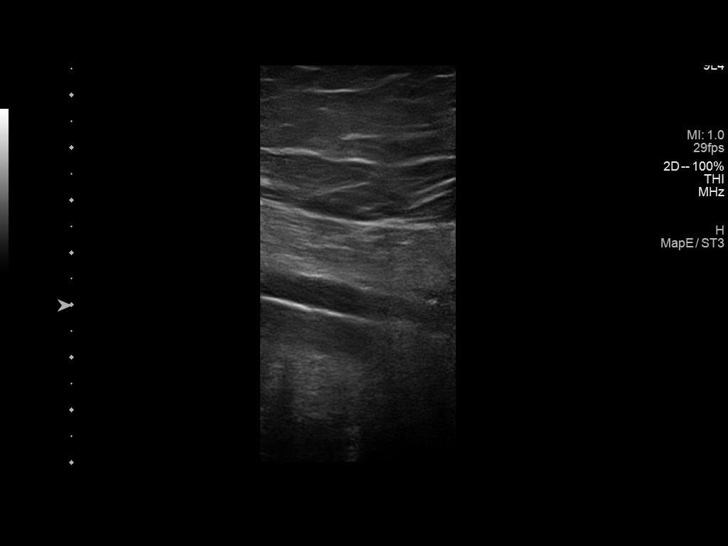
[im 101/111]
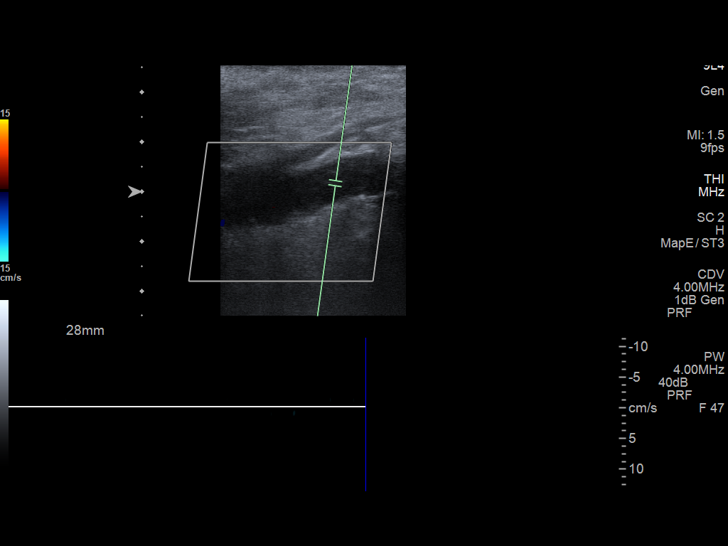
[im 111/111]
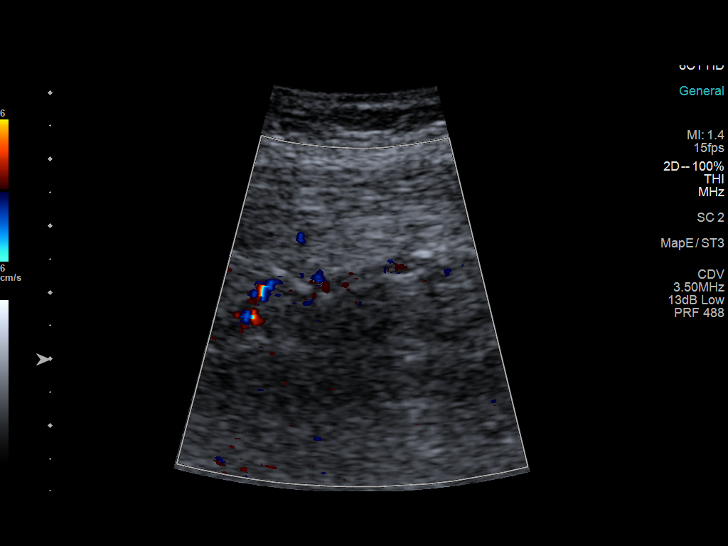

[13 of 24 positions shown; findings below may reference images not displayed]

FINDINGS: RIGHT LOWER EXTREMITY

Common Femoral Vein: No evidence of thrombus. Normal
compressibility, respiratory phasicity and response to augmentation.

Saphenofemoral Junction: No evidence of thrombus. Normal
compressibility and flow on color Doppler imaging.

Profunda Femoral Vein: No evidence of thrombus. Normal
compressibility and flow on color Doppler imaging.

Femoral Vein: No evidence of thrombus. Normal compressibility,
respiratory phasicity and response to augmentation.

Popliteal Vein: No evidence of thrombus. Normal compressibility,
respiratory phasicity and response to augmentation.

Calf Veins: No evidence of thrombus. Normal compressibility and flow
on color Doppler imaging.

Superficial Great Saphenous Vein: No evidence of thrombus. Normal
compressibility and flow on color Doppler imaging.

Other Findings:  None.

LEFT LOWER EXTREMITY

Common Femoral Vein: No evidence of thrombus. Normal
compressibility, respiratory phasicity and response to augmentation.

Saphenofemoral Junction: No evidence of thrombus. Normal
compressibility and flow on color Doppler imaging.

Profunda Femoral Vein: No evidence of thrombus. Normal
compressibility and flow on color Doppler imaging.

Femoral Vein: Occlusive thrombus through the length of the femoral
vein into the popliteal vein.

Popliteal Vein: Occlusive thrombus of the popliteal vein.

Calf Veins: No evidence of thrombus. Normal compressibility and flow
on color Doppler imaging.

Superficial Great Saphenous Vein: No evidence of thrombus. Normal
compressibility and flow on color Doppler imaging.

Other Findings:  None.
IMPRESSION: Sonographic survey of the left lower extremity positive for DVT
involving femoral vein, popliteal vein.

Sonographic survey of the right lower extremity negative for DVT.

## 2019-03-03 IMAGING — DX DG CHEST 2V
2 series · 2 of 2 positions shown · non-contrast
Comparison: None.

CLINICAL DATA: Nonproductive cough for 1 month.  Chest pain.

EXAM:
CHEST - 2 VIEW

[chest pa]
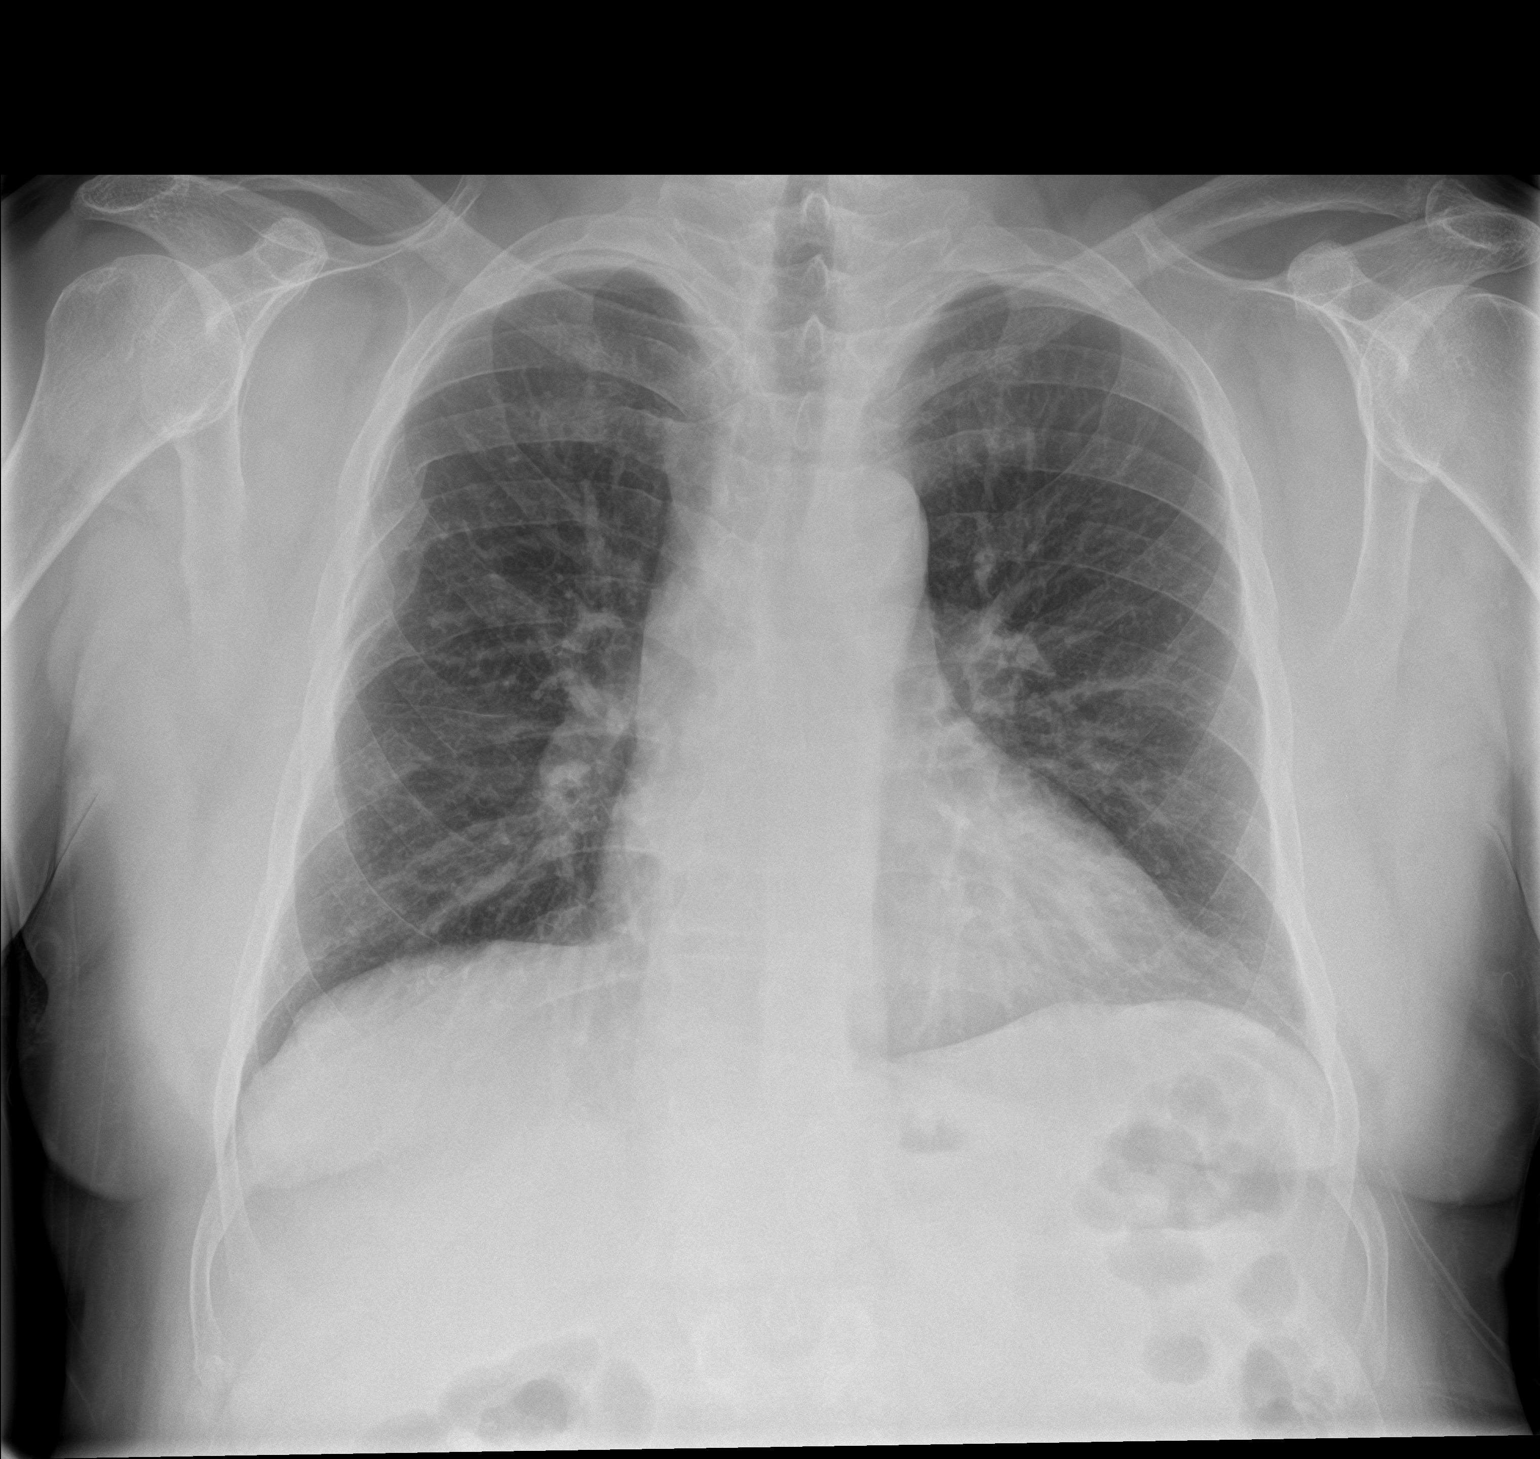

[chest lat]
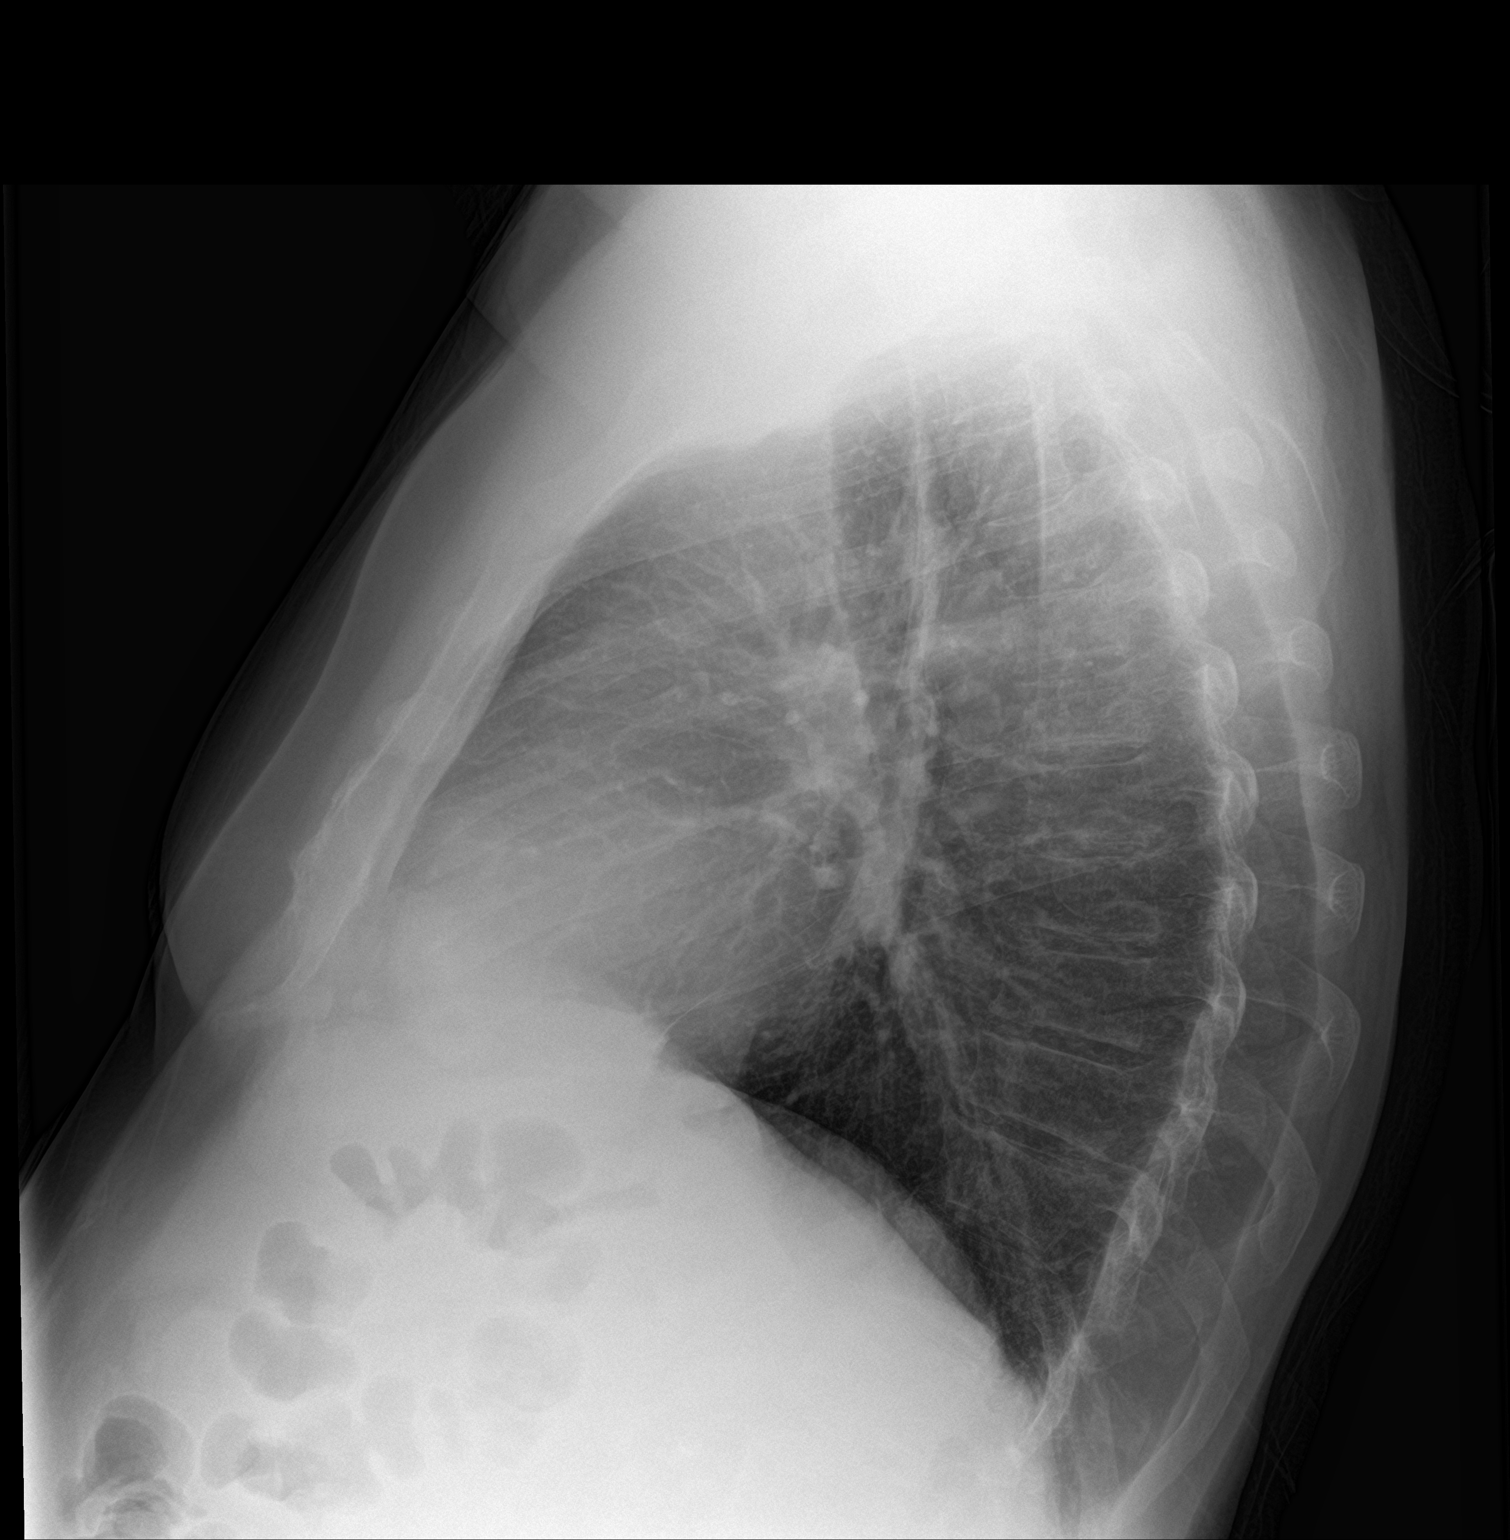

[2 of 2 positions shown; findings below may reference images not displayed]

FINDINGS: The cardiac silhouette is upper limits of normal in size. No
airspace consolidation, edema, pleural effusion, or pneumothorax is
identified. Multiple old right rib fractures are noted.
IMPRESSION: No active cardiopulmonary disease.
